# Patient Record
Sex: Female | Born: 1975 | Race: White | Hispanic: Yes | Marital: Married | Smoking: Never smoker
Health system: Southern US, Community
[De-identification: ages and names within clinical notes are randomized; demographics above are authoritative.]

## PROBLEM LIST (undated history)

## (undated) DIAGNOSIS — N200 Calculus of kidney: Secondary | ICD-10-CM

## (undated) HISTORY — DX: Calculus of kidney: N20.0

---

## 2004-01-17 ENCOUNTER — Emergency Department: Payer: Self-pay | Admitting: General Practice

## 2004-06-29 ENCOUNTER — Ambulatory Visit (HOSPITAL_COMMUNITY): Admission: RE | Admit: 2004-06-29 | Discharge: 2004-06-29 | Payer: Self-pay | Admitting: Obstetrics & Gynecology

## 2004-09-21 ENCOUNTER — Ambulatory Visit (HOSPITAL_COMMUNITY): Admission: RE | Admit: 2004-09-21 | Discharge: 2004-09-21 | Payer: Self-pay | Admitting: Obstetrics & Gynecology

## 2004-10-20 ENCOUNTER — Ambulatory Visit (HOSPITAL_COMMUNITY): Admission: RE | Admit: 2004-10-20 | Discharge: 2004-10-20 | Payer: Self-pay | Admitting: Obstetrics

## 2004-10-26 ENCOUNTER — Inpatient Hospital Stay (HOSPITAL_COMMUNITY): Admission: AD | Admit: 2004-10-26 | Discharge: 2004-10-26 | Payer: Self-pay | Admitting: Obstetrics & Gynecology

## 2004-10-29 ENCOUNTER — Inpatient Hospital Stay (HOSPITAL_COMMUNITY): Admission: AD | Admit: 2004-10-29 | Discharge: 2004-10-31 | Payer: Self-pay | Admitting: Obstetrics

## 2004-10-29 ENCOUNTER — Encounter (INDEPENDENT_AMBULATORY_CARE_PROVIDER_SITE_OTHER): Payer: Self-pay | Admitting: *Deleted

## 2004-10-29 HISTORY — PX: TUBAL LIGATION: SHX77

## 2005-08-11 ENCOUNTER — Inpatient Hospital Stay (HOSPITAL_COMMUNITY): Admission: AD | Admit: 2005-08-11 | Discharge: 2005-08-11 | Payer: Self-pay | Admitting: Gynecology

## 2007-11-21 ENCOUNTER — Emergency Department (HOSPITAL_COMMUNITY): Admission: EM | Admit: 2007-11-21 | Discharge: 2007-11-21 | Payer: Self-pay | Admitting: Emergency Medicine

## 2010-05-26 NOTE — Op Note (Signed)
[name], [name]             ACCOUNT NO.:  [id number]   MEDICAL RECORD NO.:  [id number]          PATIENT TYPE:  INP   LOCATION:  9111                          FACILITY:  [facility]   PHYSICIAN:  Courtney [name], M.D.DATE OF BIRTH:  30-Jul-1975   DATE OF PROCEDURE:  10/29/2004  DATE OF DISCHARGE:                                 OPERATIVE REPORT   PREOPERATIVE DIAGNOSIS:  Desires sterilization.   POSTOPERATIVE DIAGNOSIS:  Desires sterilization.   PROCEDURE:  Bilateral partial salpingectomy.   SURGEON:  Coral Ceo, M.D.   ANESTHESIA:  General.   ESTIMATED BLOOD LOSS:  Negligible.   COMPLICATIONS:  None.   SPECIMEN:  Approximately 2 cm segments of right and left fallopian tube.   DESCRIPTION OF PROCEDURE:  The patient was brought to operating room and  after satisfactory general endotracheal anesthesia, the abdomen was prepped  and draped in usual sterile fashion. The small inferior umbilical incision  was made with a scalpel and deepened down to fascia with a scalpel. Right-  angle retractors were placed in the incision and the fascia was grasped in  the midline with Kocher forceps and was cut in between transversely with  curved Mayo scissors. The incision was extended to left and to right with  the curved Mayo scissors. The right angle retractors were placed in the  incision and the right fallopian tube was identified and was grasped with  Babcock clamp. The tube was followed from cornual end to the fimbrial end  serially in the grasp of Babcock clamps and then regrasped in the isthmic  area of the tube with the Babcock clamp. Knuckle of tube beneath the Babcock  clamp was doubly ligated with #1 plain catgut and section of tube above the  knot was excised with Metzenbaum scissors and submitted to pathology for  evaluation. There is no active bleeding from the tubal stumps and therefore  placed back in its normal anatomic position. Same procedure was performed on  opposite  side without complications. The abdomen was then closed as follows.  The peritoneum and fascia was closed as one with continuous suture of 2-0  Vicryl. Skin was closed with continuous subcuticular suture of 3-0 Monocryl.  Sterile bandage was applied to the incision closure. Surgical technician  indicated that all needle, sponge and instrument counts were correct. The  patient tolerated procedure well and was transported to recovery room in  satisfactory condition.      Courtney [name], M.D.  Electronically Signed    CAH/MEDQ  D:  10/29/2004  T:  10/29/2004  Job:  [id number]

## 2010-10-06 ENCOUNTER — Emergency Department (HOSPITAL_COMMUNITY)
Admission: EM | Admit: 2010-10-06 | Discharge: 2010-10-06 | Disposition: A | Discharge status: Home / Self Care | Payer: No Typology Code available for payment source | Attending: Emergency Medicine | Admitting: Emergency Medicine

## 2010-10-06 DIAGNOSIS — M549 Dorsalgia, unspecified: Secondary | ICD-10-CM | POA: Insufficient documentation

## 2010-10-06 DIAGNOSIS — M545 Low back pain, unspecified: Secondary | ICD-10-CM | POA: Insufficient documentation

## 2010-10-06 DIAGNOSIS — S335XXA Sprain of ligaments of lumbar spine, initial encounter: Secondary | ICD-10-CM | POA: Insufficient documentation

## 2010-10-11 LAB — URINE MICROSCOPIC-ADD ON

## 2010-10-11 LAB — COMPREHENSIVE METABOLIC PANEL
ALT: 11
AST: 20
Albumin: 4
Alkaline Phosphatase: 54
BUN: 8
CO2: 27
Calcium: 9.2
Chloride: 107
Creatinine, Ser: 0.73
GFR calc Af Amer: >60
GFR calc non Af Amer: >60
Glucose, Bld: 96
Potassium: 3.8
Sodium: 139
Total Bilirubin: 1.2
Total Protein: 6.4

## 2010-10-11 LAB — URINE CULTURE: Colony Count: >=100000

## 2010-10-11 LAB — CBC
HCT: 37
Hemoglobin: 12.2
MCHC: 32.9
MCV: 89.8
Platelets: 187
RBC: 4.12
RDW: 12.5
WBC: 6.2

## 2010-10-11 LAB — DIFFERENTIAL
Basophils Absolute: 0
Basophils Relative: 0
Eosinophils Absolute: 0.1
Eosinophils Relative: 2
Lymphocytes Relative: 23
Lymphs Abs: 1.4
Monocytes Absolute: 0.4
Monocytes Relative: 7
Neutro Abs: 4.3
Neutrophils Relative %: 69

## 2010-10-11 LAB — URINALYSIS, ROUTINE W REFLEX MICROSCOPIC
Bilirubin Urine: NEGATIVE
Glucose, UA: NEGATIVE
Ketones, ur: NEGATIVE
Nitrite: NEGATIVE
Protein, ur: >300 — AB
Specific Gravity, Urine: 1.017
Urobilinogen, UA: 1
pH: 6

## 2010-10-11 LAB — LIPASE, BLOOD: Lipase: 28

## 2010-10-11 LAB — POCT PREGNANCY, URINE: Preg Test, Ur: NEGATIVE

## 2014-03-24 ENCOUNTER — Ambulatory Visit (INDEPENDENT_AMBULATORY_CARE_PROVIDER_SITE_OTHER): Payer: BC Managed Care – PPO | Admitting: Internal Medicine

## 2014-03-24 ENCOUNTER — Encounter: Payer: Self-pay | Admitting: Internal Medicine

## 2014-03-24 VITALS — BP 114/64 | HR 79 | Temp 98.9°F | Wt 126.0 lb

## 2014-03-24 DIAGNOSIS — J309 Allergic rhinitis, unspecified: Secondary | ICD-10-CM

## 2014-03-24 NOTE — Patient Instructions (Addendum)
  Zyrtec- take 1 pill at night x 1-2 weeks Flonase- 1 spray each nostril every morning x 1-2 weeks  Allergic Rhinitis Allergic rhinitis is when the mucous membranes in the nose respond to allergens. Allergens are particles in the air that cause your body to have an allergic reaction. This causes you to release allergic antibodies. Through a chain of events, these eventually cause you to release histamine into the blood stream. Although meant to protect the body, it is this release of histamine that causes your discomfort, such as frequent sneezing, congestion, and an itchy, runny nose.  CAUSES  Seasonal allergic rhinitis (hay fever) is caused by pollen allergens that may come from grasses, trees, and weeds. Year-round allergic rhinitis (perennial allergic rhinitis) is caused by allergens such as house dust mites, pet dander, and mold spores.  SYMPTOMS   Nasal stuffiness (congestion).  Itchy, runny nose with sneezing and tearing of the eyes. DIAGNOSIS  Your health care provider can help you determine the allergen or allergens that trigger your symptoms. If you and your health care provider are unable to determine the allergen, skin or blood testing may be used. TREATMENT  Allergic rhinitis does not have a cure, but it can be controlled by:  Medicines and allergy shots (immunotherapy).  Avoiding the allergen. Hay fever may often be treated with antihistamines in pill or nasal spray forms. Antihistamines block the effects of histamine. There are over-the-counter medicines that may help with nasal congestion and swelling around the eyes. Check with your health care provider before taking or giving this medicine.  If avoiding the allergen or the medicine prescribed do not work, there are many new medicines your health care provider can prescribe. Stronger medicine may be used if initial measures are ineffective. Desensitizing injections can be used if medicine and avoidance does not work.  Desensitization is when a patient is given ongoing shots until the body becomes less sensitive to the allergen. Make sure you follow up with your health care provider if problems continue. HOME CARE INSTRUCTIONS It is not possible to completely avoid allergens, but you can reduce your symptoms by taking steps to limit your exposure to them. It helps to know exactly what you are allergic to so that you can avoid your specific triggers. SEEK MEDICAL CARE IF:   You have a fever.  You develop a cough that does not stop easily (persistent).  You have shortness of breath.  You start wheezing.  Symptoms interfere with normal daily activities. Document Released: 09/19/2000 Document Revised: 12/30/2012 Document Reviewed: 09/01/2012 [facility] Patient Information 2015 [place]. This information is not intended to replace advice given to you by your health care provider. Make sure you discuss any questions you have with your health care provider.

## 2014-03-24 NOTE — Progress Notes (Signed)
Pre visit review using our clinic review tool, if applicable. No additional management support is needed unless otherwise documented below in the visit note. 

## 2014-03-24 NOTE — Progress Notes (Signed)
HPI  Pt presents to the clinic today with c/o nasal congestion, sore throat and cough. She reports this started 3 days ago. The cough is productive of green mucous, mostly in the morning. She is blowing clear mucous out of her nose. She denies pain with swallowing but is hoarse. She denies fever, chills or body aches. She has tried Mucinex with some relief. She has had contacts with similar symptoms, diagnosed with allergies. She has no history of allergies herself. She does not smoke.  Review of Systems     No past medical history on file.  No family history on file.  History   Social History  . Marital Status: Married    Spouse Name: N/A  . Number of Children: N/A  . Years of Education: N/A   Occupational History  . Not on file.   Social History Main Topics  . Smoking status: Not on file  . Smokeless tobacco: Not on file  . Alcohol Use: Not on file  . Drug Use: Not on file  . Sexual Activity: Not on file   Other Topics Concern  . Not on file   Social History Narrative  . No narrative on file    No Known Allergies   Constitutional: Positive headache. Denies fatigue, fever or abrupt weight changes.  HEENT:  Positive nasal congestion, sore throat. Denies eye redness, eye pain, pressure behind the eyes, facial pain, ear pain, ringing in the ears, wax buildup, runny nose or bloody nose. Respiratory: Positive cough. Denies difficulty breathing or shortness of breath.  Cardiovascular: Denies chest pain, chest tightness, palpitations or swelling in the hands or feet.   No other specific complaints in a complete review of systems (except as listed in HPI above).  Objective:   Wt 126 lb (57.153 kg) Wt Readings from Last 3 Encounters:  03/24/14 126 lb (57.153 kg)     General: Appears his stated age, well developed, well nourished in NAD. HEENT: Head: normal shape and size, no sinus tenderness noted; Ears: Tm's gray and intact, normal light reflex; Nose: mucosa boggy and  moist, septum midline; Throat/Mouth: + PND. Teeth present, mucosa erythematous and moist, no exudate noted, no lesions or ulcerations noted.  Neck: Lymphadenopathy noted.  Cardiovascular: Normal rate and rhythm. S1,S2 noted.  No murmur, rubs or gallops noted.  Pulmonary/Chest: Normal effort and positive vesicular breath sounds. No respiratory distress. No wheezes, rales or ronchi noted.      Assessment & Plan:   Allergic Rhinitis:  Get some rest and drink plenty of water Do salt water gargles for the sore throat Start Zyrtec 1 tab QHS x 1-2 weeks Start Flonase 1 spray each nostril QAM x 1 -2 weeks Watch for fever, chills, colored nasal mucous  RTC as needed or if symptoms persist.

## 2014-04-05 ENCOUNTER — Ambulatory Visit: Payer: BC Managed Care – PPO | Admitting: Internal Medicine

## 2014-04-13 ENCOUNTER — Ambulatory Visit (INDEPENDENT_AMBULATORY_CARE_PROVIDER_SITE_OTHER): Payer: BC Managed Care – PPO | Admitting: Internal Medicine

## 2014-04-13 ENCOUNTER — Other Ambulatory Visit (HOSPITAL_COMMUNITY)
Admission: RE | Admit: 2014-04-13 | Discharge: 2014-04-13 | Disposition: A | Discharge status: Home / Self Care | Payer: BC Managed Care – PPO | Source: Ambulatory Visit | Attending: Internal Medicine | Admitting: Internal Medicine

## 2014-04-13 ENCOUNTER — Encounter: Payer: Self-pay | Admitting: Internal Medicine

## 2014-04-13 VITALS — BP 114/68 | HR 72 | Temp 98.9°F | Wt 129.0 lb

## 2014-04-13 DIAGNOSIS — R6889 Other general symptoms and signs: Secondary | ICD-10-CM | POA: Diagnosis not present

## 2014-04-13 DIAGNOSIS — Z0000 Encounter for general adult medical examination without abnormal findings: Secondary | ICD-10-CM | POA: Diagnosis not present

## 2014-04-13 DIAGNOSIS — Z01419 Encounter for gynecological examination (general) (routine) without abnormal findings: Secondary | ICD-10-CM

## 2014-04-13 DIAGNOSIS — Z1151 Encounter for screening for human papillomavirus (HPV): Secondary | ICD-10-CM | POA: Insufficient documentation

## 2014-04-13 NOTE — Addendum Note (Signed)
Addended by: Roena MaladyEVONTENNO, Aviah Sorci Y on: 04/13/2014 04:53 PM   Modules accepted: Orders

## 2014-04-13 NOTE — Progress Notes (Signed)
Pre visit review using our clinic review tool, if applicable. No additional management support is needed unless otherwise documented below in the visit note. 

## 2014-04-13 NOTE — Patient Instructions (Signed)

## 2014-04-13 NOTE — Progress Notes (Signed)
HPI  Pt presents to the clinic today to establish care. She has not had a PCP in many years. She would like to get her physical exam today if she could.  Flu: never Tetanus: 2010 LMP: 03/23/2013 Pap Smear: 2006 Dentist: as needed  She does report feeling cold all the time. She would like to have her iron checked. She has never been anemic that she is aware of.  History reviewed. No pertinent past medical history.  No current outpatient prescriptions on file.   No current facility-administered medications for this visit.    No Known Allergies  Family History  Problem Relation Age of Onset  . Cancer Mother     Breast  . Lung disease Father     History   Social History  . Marital Status: Married    Spouse Name: N/A  . Number of Children: N/A  . Years of Education: N/A   Occupational History  . Not on file.   Social History Main Topics  . Smoking status: Never Smoker   . Smokeless tobacco: Never Used  . Alcohol Use: 0.0 oz/week    0 Standard drinks or equivalent per week     Comment: rare  . Drug Use: Not on file  . Sexual Activity: Not on file   Other Topics Concern  . Not on file   Social History Narrative    ROS:  Constitutional: Denies fever, malaise, fatigue, headache or abrupt weight changes.  HEENT: Denies eye pain, eye redness, ear pain, ringing in the ears, wax buildup, runny nose, nasal congestion, bloody nose, or sore throat. Respiratory: Denies difficulty breathing, shortness of breath, cough or sputum production.   Cardiovascular: Denies chest pain, chest tightness, palpitations or swelling in the hands or feet.  Gastrointestinal: Denies abdominal pain, bloating, constipation, diarrhea or blood in the stool.  GU: Denies frequency, urgency, pain with urination, blood in urine, odor or discharge. Musculoskeletal: Denies decrease in range of motion, difficulty with gait, muscle pain or joint pain and swelling.  Skin: Denies redness, rashes, lesions  or ulcercations.  Neurological: Denies dizziness, difficulty with memory, difficulty with speech or problems with balance and coordination.  Psych: She denies anxiety, depression, SI/HI  No other specific complaints in a complete review of systems (except as listed in HPI above).  PE:  BP 114/68 mmHg  Pulse 72  Temp(Src) 98.9 F (37.2 C) (Oral)  Wt 129 lb (58.514 kg)  SpO2 99%  LMP 03/23/2014 Wt Readings from Last 3 Encounters:  04/13/14 129 lb (58.514 kg)  03/24/14 126 lb (57.153 kg)    General: Appears her stated age, well developed, well nourished in NAD. HEENT: Head: normal shape and size; Eyes: sclera white, no icterus, conjunctiva pink, PERRLA and EOMs intact; Ears: Tm's gray and intact, normal light reflex; Nose: mucosa pink and moist, septum midline; Throat/Mouth: Teeth present, mucosa pink and moist, no lesions or ulcerations noted.  Neck: Neck supple, trachea midline. No masses, lumps or thyromegaly present.  Cardiovascular: Normal rate and rhythm. S1,S2 noted.  No murmur, rubs or gallops noted. No JVD or BLE edema. No carotid bruits noted. Pulmonary/Chest: Normal effort and positive vesicular breath sounds. No respiratory distress. No wheezes, rales or ronchi noted.  Abdomen: Soft and nontender. Normal bowel sounds, no bruits noted. No distention or masses noted. Liver, spleen and kidneys non palpable. Pelvic: Normal female anatomy. No discharge or odor noted. No CMT tenderness. Adnexa non palpable. Musculoskeletal: Normal range of motion. Strength 5/5 BUE/BLE. No signs of  joint swelling. No difficulty with gait.  Neurological: Alert and oriented. Cranial nerves II-XII grossly intact. Coordination normal.  Psychiatric: Mood and affect normal. Behavior is normal. Judgment and thought content normal.     BMET    Component Value Date/Time   NA 139 11/21/2007 2045   K 3.8 11/21/2007 2045   CL 107 11/21/2007 2045   CO2 27 11/21/2007 2045   GLUCOSE 96 11/21/2007 2045    BUN 8 11/21/2007 2045   CREATININE 0.73 11/21/2007 2045   CALCIUM 9.2 11/21/2007 2045   GFRNONAA >60 11/21/2007 2045   GFRAA  11/21/2007 2045    >60        The eGFR has been calculated using the MDRD equation. This calculation has not been validated in all clinical    Lipid Panel  No results found for: CHOL, TRIG, HDL, CHOLHDL, VLDL, LDLCALC  CBC    Component Value Date/Time   WBC 6.2 11/21/2007 2209   RBC 4.12 11/21/2007 2209   HGB 12.2 11/21/2007 2209   HCT 37.0 11/21/2007 2209   PLT 187 11/21/2007 2209   MCV 89.8 11/21/2007 2209   MCHC 32.9 11/21/2007 2209   RDW 12.5 11/21/2007 2209   LYMPHSABS 1.4 11/21/2007 2209   MONOABS 0.4 11/21/2007 2209   EOSABS 0.1 11/21/2007 2209   BASOSABS 0.0 11/21/2007 2209    Hgb A1C No results found for: HGBA1C   Assessment and Plan:  Preventative Health  Maintenance:  She declines flu shot Encouraged her to visit a dentist at least on an annual basis Will check CBC, CMET and Lipid Profile today Pap obtained- will send to lab  Cold sensation:  Will check TSH and Iron levels RTC in 1 year or sooner if needed

## 2014-04-14 LAB — COMPREHENSIVE METABOLIC PANEL
ALT: 10 U/L (ref 0–35)
AST: 12 U/L (ref 0–37)
Albumin: 4.3 g/dL (ref 3.5–5.2)
Alkaline Phosphatase: 47 U/L (ref 39–117)
BUN: 13 mg/dL (ref 6–23)
CO2: 28 mEq/L (ref 19–32)
Calcium: 9.4 mg/dL (ref 8.4–10.5)
Chloride: 106 mEq/L (ref 96–112)
Creatinine, Ser: 0.72 mg/dL (ref 0.40–1.20)
GFR: 96.77 mL/min (ref >60.00)
Glucose, Bld: 80 mg/dL (ref 70–99)
Potassium: 3.9 mEq/L (ref 3.5–5.1)
Sodium: 137 mEq/L (ref 135–145)
Total Bilirubin: 1.1 mg/dL (ref 0.2–1.2)
Total Protein: 6.9 g/dL (ref 6.0–8.3)

## 2014-04-14 LAB — LIPID PANEL
Cholesterol: 124 mg/dL (ref 0–200)
HDL: 46.7 mg/dL (ref >39.00)
LDL Cholesterol: 65 mg/dL (ref 0–99)
NonHDL: 77.3
Total CHOL/HDL Ratio: 3
Triglycerides: 60 mg/dL (ref 0.0–149.0)
VLDL: 12 mg/dL (ref 0.0–40.0)

## 2014-04-14 LAB — IRON: Iron: 72 ug/dL (ref 42–145)

## 2014-04-14 LAB — CBC
HCT: 36.4 % (ref 36.0–46.0)
Hemoglobin: 12.3 g/dL (ref 12.0–15.0)
MCHC: 33.8 g/dL (ref 30.0–36.0)
MCV: 87 fl (ref 78.0–100.0)
Platelets: 194 10*3/uL (ref 150.0–400.0)
RBC: 4.19 Mil/uL (ref 3.87–5.11)
RDW: 12.9 % (ref 11.5–15.5)
WBC: 5.4 10*3/uL (ref 4.0–10.5)

## 2014-04-14 LAB — TSH: TSH: 2.58 u[IU]/mL (ref 0.35–4.50)

## 2014-04-16 LAB — CYTOLOGY - PAP

## 2015-06-26 ENCOUNTER — Emergency Department (HOSPITAL_COMMUNITY)
Admission: EM | Admit: 2015-06-26 | Discharge: 2015-06-26 | Disposition: A | Discharge status: Home / Self Care | Payer: BC Managed Care – PPO | Attending: Emergency Medicine | Admitting: Emergency Medicine

## 2015-06-26 ENCOUNTER — Encounter (HOSPITAL_COMMUNITY): Payer: Self-pay | Admitting: Emergency Medicine

## 2015-06-26 ENCOUNTER — Emergency Department (HOSPITAL_COMMUNITY): Payer: BC Managed Care – PPO

## 2015-06-26 DIAGNOSIS — R109 Unspecified abdominal pain: Secondary | ICD-10-CM | POA: Diagnosis present

## 2015-06-26 DIAGNOSIS — N200 Calculus of kidney: Secondary | ICD-10-CM | POA: Diagnosis not present

## 2015-06-26 DIAGNOSIS — N1330 Unspecified hydronephrosis: Secondary | ICD-10-CM | POA: Insufficient documentation

## 2015-06-26 LAB — CBC WITH DIFFERENTIAL/PLATELET
Basophils Absolute: 0 10*3/uL (ref 0.0–0.1)
Basophils Relative: 0 %
Eosinophils Absolute: 0 10*3/uL (ref 0.0–0.7)
Eosinophils Relative: 0 %
HCT: 37.2 % (ref 36.0–46.0)
Hemoglobin: 12.2 g/dL (ref 12.0–15.0)
Lymphocytes Relative: 9 %
Lymphs Abs: 0.2 10*3/uL — ABNORMAL LOW (ref 0.7–4.0)
MCH: 28.8 pg (ref 26.0–34.0)
MCHC: 32.8 g/dL (ref 30.0–36.0)
MCV: 87.9 fL (ref 78.0–100.0)
Monocytes Absolute: 0 10*3/uL — ABNORMAL LOW (ref 0.1–1.0)
Monocytes Relative: 0 %
Neutro Abs: 2.4 10*3/uL (ref 1.7–7.7)
Neutrophils Relative %: 91 %
Platelets: 155 10*3/uL (ref 150–400)
RBC: 4.23 MIL/uL (ref 3.87–5.11)
RDW: 12.2 % (ref 11.5–15.5)
WBC: 2.7 10*3/uL — ABNORMAL LOW (ref 4.0–10.5)

## 2015-06-26 LAB — COMPREHENSIVE METABOLIC PANEL
ALT: 12 U/L — ABNORMAL LOW (ref 14–54)
AST: 19 U/L (ref 15–41)
Albumin: 4.3 g/dL (ref 3.5–5.0)
Alkaline Phosphatase: 44 U/L (ref 38–126)
Anion gap: 5 (ref 5–15)
BUN: 10 mg/dL (ref 6–20)
CO2: 24 mmol/L (ref 22–32)
Calcium: 8.9 mg/dL (ref 8.9–10.3)
Chloride: 107 mmol/L (ref 101–111)
Creatinine, Ser: 0.88 mg/dL (ref 0.44–1.00)
GFR calc Af Amer: >60 mL/min (ref >60)
GFR calc non Af Amer: >60 mL/min (ref >60)
Glucose, Bld: 137 mg/dL — ABNORMAL HIGH (ref 65–99)
Potassium: 3.3 mmol/L — ABNORMAL LOW (ref 3.5–5.1)
Sodium: 136 mmol/L (ref 135–145)
Total Bilirubin: 1.6 mg/dL — ABNORMAL HIGH (ref 0.3–1.2)
Total Protein: 6.7 g/dL (ref 6.5–8.1)

## 2015-06-26 LAB — URINALYSIS, ROUTINE W REFLEX MICROSCOPIC
Bilirubin Urine: NEGATIVE
Glucose, UA: NEGATIVE mg/dL
Hgb urine dipstick: NEGATIVE
Ketones, ur: NEGATIVE mg/dL
Leukocytes, UA: NEGATIVE
Nitrite: NEGATIVE
Protein, ur: NEGATIVE mg/dL
Specific Gravity, Urine: 1.014 (ref 1.005–1.030)
pH: 7.5 (ref 5.0–8.0)

## 2015-06-26 LAB — I-STAT BETA HCG BLOOD, ED (MC, WL, AP ONLY): I-stat hCG, quantitative: <5 m[IU]/mL (ref <5)

## 2015-06-26 MED ORDER — FENTANYL CITRATE (PF) 100 MCG/2ML IJ SOLN
50.0000 ug | INTRAMUSCULAR | Status: DC | PRN
Start: 2015-06-26 — End: 2015-06-26
  Administered 2015-06-26: 50 ug via INTRAVENOUS
  Filled 2015-06-26 (×2): qty 2

## 2015-06-26 MED ORDER — TAMSULOSIN HCL 0.4 MG PO CAPS: 0.4000 mg | ORAL_CAPSULE | Freq: Every day | ORAL | Status: DC

## 2015-06-26 MED ORDER — OXYCODONE-ACETAMINOPHEN 5-325 MG PO TABS: 1.0000 | ORAL_TABLET | ORAL | Status: DC | PRN

## 2015-06-26 MED ORDER — MORPHINE SULFATE (PF) 4 MG/ML IV SOLN
4.0000 mg | Freq: Once | INTRAVENOUS | Status: AC
  Administered 2015-06-26: 4 mg via INTRAVENOUS
  Filled 2015-06-26: qty 1

## 2015-06-26 MED ORDER — IBUPROFEN 600 MG PO TABS: 600.0000 mg | ORAL_TABLET | Freq: Four times a day (QID) | ORAL | Status: DC | PRN

## 2015-06-26 MED ORDER — SODIUM CHLORIDE 0.9 % IV BOLUS (SEPSIS)
1000.0000 mL | Freq: Once | INTRAVENOUS | Status: AC
  Administered 2015-06-26: 1000 mL via INTRAVENOUS

## 2015-06-26 MED ORDER — ONDANSETRON 8 MG PO TBDP: 8.0000 mg | ORAL_TABLET | Freq: Three times a day (TID) | ORAL | Status: DC | PRN

## 2015-06-26 MED ORDER — IOPAMIDOL (ISOVUE-300) INJECTION 61%
INTRAVENOUS | Status: AC
  Administered 2015-06-26: 100 mL
  Filled 2015-06-26: qty 100

## 2015-06-26 MED ORDER — KETOROLAC TROMETHAMINE 30 MG/ML IJ SOLN
30.0000 mg | Freq: Once | INTRAMUSCULAR | Status: AC
  Administered 2015-06-26: 30 mg via INTRAVENOUS
  Filled 2015-06-26: qty 1

## 2015-06-26 MED ORDER — MORPHINE SULFATE (PF) 4 MG/ML IV SOLN
8.0000 mg | Freq: Once | INTRAVENOUS | Status: AC
  Administered 2015-06-26: 8 mg via INTRAVENOUS
  Filled 2015-06-26: qty 2

## 2015-06-26 MED ORDER — ONDANSETRON HCL 4 MG/2ML IJ SOLN
4.0000 mg | Freq: Once | INTRAMUSCULAR | Status: AC
  Administered 2015-06-26: 4 mg via INTRAVENOUS
  Filled 2015-06-26: qty 2

## 2015-06-26 NOTE — ED Notes (Signed)
Pt sts right sided flank pain starting this am that is severe with some N/V

## 2015-06-26 NOTE — ED Provider Notes (Signed)
CSN: [id number]     Arrival date & time 06/26/15  1350 History   First MD Initiated Contact with Patient 06/26/15 1419     Chief Complaint  Patient presents with  . Flank Pain     (Consider location/radiation/quality/duration/timing/severity/associated sxs/prior Treatment) HPI   Patient is a 40 year old female with no significant past medical history who presents the ED with right flank pain since she woke this morning around 9 AM. Pain has progressively gotten worse, radiating into her right buttocks and around to her right lower quadrant, severe, 10/10, she took 2 Aleve which did not help. Movement of her right leg makes the pain worse. She states associated nausea, vomiting and decreased urine. She denies fever, chills, dysuria, hematuria, changes in bowel habits, Vaginal discharge or vaginal bleeding, chest pain, shortness of breath. Of note, patient has been fasting for Ramadan.  History reviewed. No pertinent past medical history. Past Surgical History  Procedure Laterality Date  . Tubal ligation  10/29/2004   Family History  Problem Relation Age of Onset  . Cancer Mother     Breast  . Lung disease Father    Social History  Substance Use Topics  . Smoking status: Never Smoker   . Smokeless tobacco: Never Used  . Alcohol Use: 0.0 oz/week    0 Standard drinks or equivalent per week     Comment: rare   OB History    No data available     Review of Systems  Constitutional: Negative for fever and chills.  Eyes: Negative for visual disturbance.  Respiratory: Negative for shortness of breath.   Cardiovascular: Negative for chest pain.  Gastrointestinal: Positive for nausea and vomiting. Negative for abdominal pain, diarrhea, constipation and abdominal distention.  Genitourinary: Positive for flank pain and decreased urine volume. Negative for dysuria, hematuria, vaginal bleeding, vaginal discharge and difficulty urinating.  Musculoskeletal: Negative for neck pain.  Skin:  Negative for rash.  Allergic/Immunologic: Negative for immunocompromised state.  Neurological: Negative for dizziness, syncope, weakness and headaches.      Allergies  Review of patient's allergies indicates no known allergies.  Home Medications   Prior to Admission medications   Not on File   BP 122/61 mmHg  Pulse 76  Temp(Src) 98.3 F (36.8 C) (Oral)  Resp 24  SpO2 98% Physical Exam  Constitutional: She appears well-developed and well-nourished. No distress.  HENT:  Head: Normocephalic and atraumatic.  Eyes: Conjunctivae are normal.  Neck: Normal range of motion.  Cardiovascular: Normal rate, regular rhythm and normal heart sounds.  Exam reveals no gallop and no friction rub.   No murmur heard. Pulses:      Dorsalis pedis pulses are 2+ on the right side, and 2+ on the left side.  Pulmonary/Chest: Effort normal.  Abdominal: Normal appearance and bowel sounds are normal. She exhibits no distension. There is no rebound and no CVA tenderness.  Palpation of the right lower quadrant reproduced pain in the right flank  Musculoskeletal: Normal range of motion. She exhibits no edema.  Examination of patient's back and flank revealed no deformities, ecchymosis, patient was not tender to palpation of the thoracic or cervical spine or paraspinal muscles, patient was TTP of the right flank area  Neurological: She is alert. Coordination normal.  Skin: Skin is warm and dry. No rash noted. She is not diaphoretic.  Psychiatric: She has a normal mood and affect. Her behavior is normal.  Nursing note and vitals reviewed.   ED Course  Procedures (including critical care  time) Labs Review Labs Reviewed  CBC WITH DIFFERENTIAL/PLATELET - Abnormal; Notable for the following:    WBC 2.7 (*)    Lymphs Abs 0.2 (*)    Monocytes Absolute 0.0 (*)    All other components within normal limits  COMPREHENSIVE METABOLIC PANEL - Abnormal; Notable for the following:    Potassium 3.3 (*)    Glucose,  Bld 137 (*)    ALT 12 (*)    Total Bilirubin 1.6 (*)    All other components within normal limits  URINALYSIS, ROUTINE W REFLEX MICROSCOPIC (NOT AT [facility])  I-STAT BETA HCG BLOOD, ED ([facility], [facility], [facility] ONLY)    Imaging Review No results found. I have personally reviewed and evaluated these images and lab results as part of my medical decision-making.   EKG Interpretation None      MDM   Final diagnoses:  None   D/t patient's presentation and history this is likely kidney stones. Differential diagnoses include ovarian torsion and appendicitis. Plan is to get a CT with oral contrast and urinalysis to evaluate for kidney stones and appendicitis. If those are negative, plan for pelvic ultrasound and pelvic exam.   Patient care was signed out to [employer] Corporation[employer]ana Kirichenko, PA-C at change of shift.     Jerre SimonJessica L Shaul Trautman, PA 06/26/15 1551  Gerhard Munchobert Lockwood, MD 06/27/15 [phone]

## 2015-06-26 NOTE — ED Provider Notes (Signed)
3:47 PM Pt signed out to me at shift change. Pt with acute onset of right lower back pain that is radiating into right lower abdomen and right leg. States woke up with it this mroning. Reports some nausea and vomiting. Denies fever. Reports urinating a little at a time, no other urinary symptoms or hematuria. Pt with RLQ tenderness and right cva tenderness. Labs with no significant findings. Will get CT abd and pelvis with contrast to evaluate for hydronephrosis and appendicitis. Pt also reports pain coming back. Will reorder morphine.   5:47 PM CT showing obstructing 6 mm distal right ureteral stone with moderate to severe right obstructive uropathy. Urine with no signs of infection. Patient's pain is controlled at this time, she rates it as 0 out of 10. Plan to discharge her home with close urology follow-up given the size of her stone. Will treat with ibuprofen, Percocet, Zofran, Flomax. Return precautions for uncontrolled pain, vomiting, fever discussed.  Filed Vitals:   06/26/15 1358 06/26/15 1500 06/26/15 1700 06/26/15 1743  BP: 111/66 122/61 121/75 128/76  Pulse: 87 76 100 121  Temp: 98.3 F (36.8 C)     TempSrc: Oral     Resp: 24   12  SpO2: 100% 98% 97% 97%     Jaynie Crumbleatyana Jocilynn Grade, PA-C 06/26/15 1748  Lavera Guiseana Duo Liu, MD 06/27/15 0040

## 2015-06-26 NOTE — Discharge Instructions (Signed)
Ibuprofen for pain as prescribed. Percocet for severe pain as prescribed as needed. zofran for nausea. flomax to help to pass the stone. Follow up with urology. Return if persistent vomiting, unable to control pain, fever, any new concerning symptoms.    Kidney Stones Kidney stones (urolithiasis) are deposits that form inside your kidneys. The intense pain is caused by the stone moving through the urinary tract. When the stone moves, the ureter goes into spasm around the stone. The stone is usually passed in the urine.  CAUSES   A disorder that makes certain neck glands produce too much parathyroid hormone (primary hyperparathyroidism).  A buildup of uric acid crystals, similar to gout in your joints.  Narrowing (stricture) of the ureter.  A kidney obstruction present at birth (congenital obstruction).  Previous surgery on the kidney or ureters.  Numerous kidney infections. SYMPTOMS   Feeling sick to your stomach (nauseous).  Throwing up (vomiting).  Blood in the urine (hematuria).  Pain that usually spreads (radiates) to the groin.  Frequency or urgency of urination. DIAGNOSIS   Taking a history and physical exam.  Blood or urine tests.  CT scan.  Occasionally, an examination of the inside of the urinary bladder (cystoscopy) is performed. TREATMENT   Observation.  Increasing your fluid intake.  Extracorporeal shock wave lithotripsy--This is a noninvasive procedure that uses shock waves to break up kidney stones.  Surgery may be needed if you have severe pain or persistent obstruction. There are various surgical procedures. Most of the procedures are performed with the use of small instruments. Only small incisions are needed to accommodate these instruments, so recovery time is minimized. The size, location, and chemical composition are all important variables that will determine the proper choice of action for you. Talk to your health care provider to better  understand your situation so that you will minimize the risk of injury to yourself and your kidney.  HOME CARE INSTRUCTIONS   Drink enough water and fluids to keep your urine clear or pale yellow. This will help you to pass the stone or stone fragments.  Strain all urine through the provided strainer. Keep all particulate matter and stones for your health care provider to see. The stone causing the pain may be as small as a grain of salt. It is very important to use the strainer each and every time you pass your urine. The collection of your stone will allow your health care provider to analyze it and verify that a stone has actually passed. The stone analysis will often identify what you can do to reduce the incidence of recurrences.  Only take over-the-counter or prescription medicines for pain, discomfort, or fever as directed by your health care provider.  Keep all follow-up visits as told by your health care provider. This is important.  Get follow-up X-rays if required. The absence of pain does not always mean that the stone has passed. It may have only stopped moving. If the urine remains completely obstructed, it can cause loss of kidney function or even complete destruction of the kidney. It is your responsibility to make sure X-rays and follow-ups are completed. Ultrasounds of the kidney can show blockages and the status of the kidney. Ultrasounds are not associated with any radiation and can be performed easily in a matter of minutes.  Make changes to your daily diet as told by your health care provider. You may be told to:  Limit the amount of salt that you eat.  Eat 5 or  more servings of fruits and vegetables each day.  Limit the amount of meat, poultry, fish, and eggs that you eat.  Collect a 24-hour urine sample as told by your health care provider.You may need to collect another urine sample every 6-12 months. SEEK MEDICAL CARE IF:  You experience pain that is progressive and  unresponsive to any pain medicine you have been prescribed. SEEK IMMEDIATE MEDICAL CARE IF:   Pain cannot be controlled with the prescribed medicine.  You have a fever or shaking chills.  The severity or intensity of pain increases over 18 hours and is not relieved by pain medicine.  You develop a new onset of abdominal pain.  You feel faint or pass out.  You are unable to urinate.   This information is not intended to replace advice given to you by your health care provider. Make sure you discuss any questions you have with your health care provider.   Document Released: 12/25/2004 Document Revised: 09/15/2014 Document Reviewed: 05/28/2012 Elsevier Interactive Patient Education [employer]2016 [employer] Inc.

## 2015-06-28 ENCOUNTER — Other Ambulatory Visit: Payer: Self-pay

## 2015-06-28 ENCOUNTER — Inpatient Hospital Stay (HOSPITAL_COMMUNITY): Payer: BC Managed Care – PPO

## 2015-06-28 ENCOUNTER — Inpatient Hospital Stay (HOSPITAL_COMMUNITY)
Admission: EM | Admit: 2015-06-28 | Discharge: 2015-07-06 | DRG: 871 | Disposition: A | Discharge status: Home Health | Payer: BC Managed Care – PPO | Attending: Internal Medicine | Admitting: Internal Medicine

## 2015-06-28 ENCOUNTER — Emergency Department (HOSPITAL_COMMUNITY): Payer: BC Managed Care – PPO

## 2015-06-28 ENCOUNTER — Encounter (HOSPITAL_COMMUNITY): Payer: Self-pay | Admitting: Emergency Medicine

## 2015-06-28 DIAGNOSIS — E876 Hypokalemia: Secondary | ICD-10-CM | POA: Diagnosis present

## 2015-06-28 DIAGNOSIS — N201 Calculus of ureter: Secondary | ICD-10-CM | POA: Diagnosis not present

## 2015-06-28 DIAGNOSIS — B9620 Unspecified Escherichia coli [E. coli] as the cause of diseases classified elsewhere: Secondary | ICD-10-CM | POA: Diagnosis present

## 2015-06-28 DIAGNOSIS — R6521 Severe sepsis with septic shock: Secondary | ICD-10-CM | POA: Diagnosis present

## 2015-06-28 DIAGNOSIS — A419 Sepsis, unspecified organism: Secondary | ICD-10-CM

## 2015-06-28 DIAGNOSIS — N139 Obstructive and reflux uropathy, unspecified: Secondary | ICD-10-CM | POA: Diagnosis present

## 2015-06-28 DIAGNOSIS — A4151 Sepsis due to Escherichia coli [E. coli]: Principal | ICD-10-CM | POA: Diagnosis present

## 2015-06-28 DIAGNOSIS — N179 Acute kidney failure, unspecified: Secondary | ICD-10-CM | POA: Diagnosis present

## 2015-06-28 DIAGNOSIS — E872 Acidosis: Secondary | ICD-10-CM | POA: Diagnosis present

## 2015-06-28 DIAGNOSIS — D696 Thrombocytopenia, unspecified: Secondary | ICD-10-CM | POA: Diagnosis present

## 2015-06-28 DIAGNOSIS — N200 Calculus of kidney: Secondary | ICD-10-CM | POA: Diagnosis not present

## 2015-06-28 DIAGNOSIS — Z87442 Personal history of urinary calculi: Secondary | ICD-10-CM

## 2015-06-28 DIAGNOSIS — E860 Dehydration: Secondary | ICD-10-CM | POA: Diagnosis present

## 2015-06-28 DIAGNOSIS — N10 Acute tubulo-interstitial nephritis: Secondary | ICD-10-CM | POA: Diagnosis present

## 2015-06-28 DIAGNOSIS — N135 Crossing vessel and stricture of ureter without hydronephrosis: Secondary | ICD-10-CM | POA: Insufficient documentation

## 2015-06-28 DIAGNOSIS — N390 Urinary tract infection, site not specified: Secondary | ICD-10-CM

## 2015-06-28 DIAGNOSIS — N3000 Acute cystitis without hematuria: Secondary | ICD-10-CM | POA: Diagnosis not present

## 2015-06-28 DIAGNOSIS — R7881 Bacteremia: Secondary | ICD-10-CM | POA: Diagnosis not present

## 2015-06-28 DIAGNOSIS — N12 Tubulo-interstitial nephritis, not specified as acute or chronic: Secondary | ICD-10-CM | POA: Diagnosis not present

## 2015-06-28 DIAGNOSIS — Z809 Family history of malignant neoplasm, unspecified: Secondary | ICD-10-CM

## 2015-06-28 DIAGNOSIS — N136 Pyonephrosis: Secondary | ICD-10-CM | POA: Diagnosis present

## 2015-06-28 DIAGNOSIS — I959 Hypotension, unspecified: Secondary | ICD-10-CM | POA: Diagnosis present

## 2015-06-28 LAB — COMPREHENSIVE METABOLIC PANEL
ALT: 32 U/L (ref 14–54)
AST: 63 U/L — ABNORMAL HIGH (ref 15–41)
Albumin: 3.4 g/dL — ABNORMAL LOW (ref 3.5–5.0)
Alkaline Phosphatase: 72 U/L (ref 38–126)
Anion gap: 13 (ref 5–15)
BUN: 53 mg/dL — ABNORMAL HIGH (ref 6–20)
CHLORIDE: 97 mmol/L — AB (ref 101–111)
CO2: 19 mmol/L — AB (ref 22–32)
CREATININE: 4.53 mg/dL — AB (ref 0.44–1.00)
Calcium: 6.8 mg/dL — ABNORMAL LOW (ref 8.9–10.3)
GFR, EST AFRICAN AMERICAN: 13 mL/min — AB (ref >60)
GFR, EST NON AFRICAN AMERICAN: 11 mL/min — AB (ref >60)
Glucose, Bld: 83 mg/dL (ref 65–99)
Potassium: 5.1 mmol/L (ref 3.5–5.1)
SODIUM: 129 mmol/L — AB (ref 135–145)
TOTAL PROTEIN: 5.5 g/dL — AB (ref 6.5–8.1)
Total Bilirubin: 1.1 mg/dL (ref 0.3–1.2)

## 2015-06-28 LAB — URINALYSIS, ROUTINE W REFLEX MICROSCOPIC
Bilirubin Urine: NEGATIVE
Glucose, UA: NEGATIVE mg/dL
Ketones, ur: NEGATIVE mg/dL
NITRITE: NEGATIVE
PROTEIN: 30 mg/dL — AB
SPECIFIC GRAVITY, URINE: 1.008 (ref 1.005–1.030)
pH: 5.5 (ref 5.0–8.0)

## 2015-06-28 LAB — CBC WITH DIFFERENTIAL/PLATELET
BASOS ABS: 0 10*3/uL (ref 0.0–0.1)
Basophils Relative: 0 %
Eosinophils Absolute: 0.1 10*3/uL (ref 0.0–0.7)
Eosinophils Relative: 1 %
HEMATOCRIT: 28.3 % — AB (ref 36.0–46.0)
HEMOGLOBIN: 9.8 g/dL — AB (ref 12.0–15.0)
LYMPHS ABS: 0.4 10*3/uL — AB (ref 0.7–4.0)
Lymphocytes Relative: 3 %
MCH: 28.8 pg (ref 26.0–34.0)
MCHC: 34.6 g/dL (ref 30.0–36.0)
MCV: 83.2 fL (ref 78.0–100.0)
MONOS PCT: 3 %
Monocytes Absolute: 0.4 10*3/uL (ref 0.1–1.0)
NEUTROS ABS: 13.1 10*3/uL — AB (ref 1.7–7.7)
Neutrophils Relative %: 93 %
Platelets: 51 10*3/uL — ABNORMAL LOW (ref 150–400)
RBC: 3.4 MIL/uL — AB (ref 3.87–5.11)
RDW: 12.9 % (ref 11.5–15.5)
WBC: 14 10*3/uL — ABNORMAL HIGH (ref 4.0–10.5)

## 2015-06-28 LAB — BASIC METABOLIC PANEL
Anion gap: 9 (ref 5–15)
BUN: 49 mg/dL — ABNORMAL HIGH (ref 6–20)
CALCIUM: 5.6 mg/dL — AB (ref 8.9–10.3)
CO2: 15 mmol/L — ABNORMAL LOW (ref 22–32)
Chloride: 109 mmol/L (ref 101–111)
Creatinine, Ser: 3.53 mg/dL — ABNORMAL HIGH (ref 0.44–1.00)
GFR calc Af Amer: 18 mL/min — ABNORMAL LOW (ref >60)
GFR, EST NON AFRICAN AMERICAN: 15 mL/min — AB (ref >60)
GLUCOSE: 89 mg/dL (ref 65–99)
POTASSIUM: 4.6 mmol/L (ref 3.5–5.1)
SODIUM: 133 mmol/L — AB (ref 135–145)

## 2015-06-28 LAB — URINE MICROSCOPIC-ADD ON

## 2015-06-28 LAB — I-STAT BETA HCG BLOOD, ED (MC, WL, AP ONLY)

## 2015-06-28 LAB — LACTIC ACID, PLASMA: LACTIC ACID, VENOUS: 2.6 mmol/L — AB (ref 0.5–2.0)

## 2015-06-28 LAB — CORTISOL: Cortisol, Plasma: 20.1 ug/dL

## 2015-06-28 LAB — I-STAT CG4 LACTIC ACID, ED
LACTIC ACID, VENOUS: 3.41 mmol/L — AB (ref 0.5–2.0)
Lactic Acid, Venous: 2.02 mmol/L (ref 0.5–2.0)

## 2015-06-28 LAB — CARBOXYHEMOGLOBIN
CARBOXYHEMOGLOBIN: 1 % (ref 0.5–1.5)
METHEMOGLOBIN: 0.8 % (ref 0.0–1.5)
O2 Saturation: 84.4 %
TOTAL HEMOGLOBIN: 9.8 g/dL — AB (ref 12.0–16.0)

## 2015-06-28 LAB — MAGNESIUM: Magnesium: 1.2 mg/dL — ABNORMAL LOW (ref 1.7–2.4)

## 2015-06-28 LAB — PROCALCITONIN: PROCALCITONIN: 81.95 ng/mL

## 2015-06-28 LAB — CK: CK TOTAL: 1067 U/L — AB (ref 38–234)

## 2015-06-28 LAB — D-DIMER, QUANTITATIVE: D-Dimer, Quant: 19.57 ug/mL-FEU — ABNORMAL HIGH (ref 0.00–0.50)

## 2015-06-28 LAB — APTT: aPTT: 40 seconds — ABNORMAL HIGH (ref 24–37)

## 2015-06-28 LAB — PHOSPHORUS: PHOSPHORUS: 3.9 mg/dL (ref 2.5–4.6)

## 2015-06-28 LAB — PROTIME-INR
INR: 1.75 — AB (ref 0.00–1.49)
Prothrombin Time: 20.4 seconds — ABNORMAL HIGH (ref 11.6–15.2)

## 2015-06-28 LAB — MRSA PCR SCREENING: MRSA BY PCR: NEGATIVE

## 2015-06-28 MED ORDER — SODIUM CHLORIDE 0.9 % IV SOLN: INTRAVENOUS | Status: DC

## 2015-06-28 MED ORDER — SODIUM CHLORIDE 0.9 % IV BOLUS (SEPSIS)
1000.0000 mL | Freq: Once | INTRAVENOUS | Status: AC
  Administered 2015-06-28: 1000 mL via INTRAVENOUS

## 2015-06-28 MED ORDER — FENTANYL CITRATE (PF) 100 MCG/2ML IJ SOLN
INTRAMUSCULAR | Status: AC | PRN
  Administered 2015-06-28 (×2): 25 ug via INTRAVENOUS

## 2015-06-28 MED ORDER — CHLORHEXIDINE GLUCONATE 0.12 % MT SOLN
15.0000 mL | Freq: Two times a day (BID) | OROMUCOSAL | Status: DC
  Administered 2015-06-29: 15 mL via OROMUCOSAL

## 2015-06-28 MED ORDER — PHENYLEPHRINE HCL 10 MG/ML IJ SOLN
20.0000 ug/min | INTRAVENOUS | Status: DC
  Filled 2015-06-28 (×2): qty 1

## 2015-06-28 MED ORDER — DEXTROSE 5 % IV SOLN
2.0000 g | INTRAVENOUS | Status: DC
  Administered 2015-06-29 – 2015-06-30 (×2): 2 g via INTRAVENOUS
  Filled 2015-06-28 (×3): qty 2

## 2015-06-28 MED ORDER — ONDANSETRON HCL 4 MG/2ML IJ SOLN
4.0000 mg | Freq: Once | INTRAMUSCULAR | Status: AC
  Administered 2015-06-28: 4 mg via INTRAVENOUS
  Filled 2015-06-28: qty 2

## 2015-06-28 MED ORDER — MENTHOL 3 MG MT LOZG: 1.0000 | LOZENGE | OROMUCOSAL | Status: DC | PRN

## 2015-06-28 MED ORDER — LIDOCAINE HCL 1 % IJ SOLN
INTRAMUSCULAR | Status: AC | PRN
  Administered 2015-06-28: 10 mL

## 2015-06-28 MED ORDER — DEXTROSE 5 % IV SOLN: 2.0000 g | Freq: Once | INTRAVENOUS | Status: DC

## 2015-06-28 MED ORDER — PHENYLEPHRINE HCL 10 MG/ML IJ SOLN
0.0000 ug/min | Freq: Once | INTRAMUSCULAR | Status: AC
  Administered 2015-06-28: 50 ug/min via INTRAVENOUS
  Filled 2015-06-28: qty 1

## 2015-06-28 MED ORDER — IOPAMIDOL (ISOVUE-300) INJECTION 61%
10.0000 mL | Freq: Once | INTRAVENOUS | Status: AC | PRN
  Administered 2015-06-28: 10 mL

## 2015-06-28 MED ORDER — NOREPINEPHRINE BITARTRATE 1 MG/ML IV SOLN
5.0000 ug/min | INTRAVENOUS | Status: DC
  Administered 2015-06-28: 5 ug/min via INTRAVENOUS
  Filled 2015-06-28: qty 4

## 2015-06-28 MED ORDER — FENTANYL CITRATE (PF) 100 MCG/2ML IJ SOLN
INTRAMUSCULAR | Status: AC
  Filled 2015-06-28: qty 2

## 2015-06-28 MED ORDER — ONDANSETRON HCL 4 MG/2ML IJ SOLN
4.0000 mg | Freq: Four times a day (QID) | INTRAMUSCULAR | Status: DC | PRN
  Administered 2015-06-28: 4 mg via INTRAVENOUS
  Filled 2015-06-28: qty 2

## 2015-06-28 MED ORDER — CETYLPYRIDINIUM CHLORIDE 0.05 % MT LIQD
7.0000 mL | Freq: Two times a day (BID) | OROMUCOSAL | Status: DC
  Administered 2015-06-28 – 2015-06-29 (×2): 7 mL via OROMUCOSAL

## 2015-06-28 MED ORDER — SODIUM CHLORIDE 0.9 % IV SOLN: 500.0000 mL | INTRAVENOUS | Status: DC | PRN

## 2015-06-28 MED ORDER — LIDOCAINE HCL 1 % IJ SOLN
INTRAMUSCULAR | Status: AC
  Filled 2015-06-28: qty 20

## 2015-06-28 MED ORDER — PHENOL 1.4 % MT LIQD
1.0000 | OROMUCOSAL | Status: DC | PRN
  Filled 2015-06-28: qty 177

## 2015-06-28 MED ORDER — DEXTROSE 5 % IV SOLN: 1.0000 g | INTRAVENOUS | Status: DC

## 2015-06-28 MED ORDER — SODIUM CHLORIDE 0.9 % IV SOLN: 250.0000 mL | INTRAVENOUS | Status: DC | PRN

## 2015-06-28 MED ORDER — NOREPINEPHRINE BITARTRATE 1 MG/ML IV SOLN
5.0000 ug/min | INTRAVENOUS | Status: DC
  Administered 2015-06-28: 25 ug/min via INTRAVENOUS
  Administered 2015-06-29: 15 ug/min via INTRAVENOUS
  Filled 2015-06-28 (×2): qty 16

## 2015-06-28 MED ORDER — CEFTRIAXONE SODIUM 2 G IJ SOLR
2.0000 g | Freq: Once | INTRAMUSCULAR | Status: AC
  Administered 2015-06-28: 2 g via INTRAVENOUS
  Filled 2015-06-28: qty 2

## 2015-06-28 MED ORDER — SODIUM CHLORIDE 0.9 % IV SOLN
Freq: Once | INTRAVENOUS | Status: AC
  Administered 2015-06-28: 13:00:00 via INTRAVENOUS

## 2015-06-28 MED ORDER — SODIUM CHLORIDE 0.9 % IV BOLUS (SEPSIS)
1000.0000 mL | Freq: Once | INTRAVENOUS | Status: AC
Start: 2015-06-28 — End: 2015-06-28
  Administered 2015-06-28: 1000 mL via INTRAVENOUS

## 2015-06-28 NOTE — Procedures (Signed)
Central Venous Catheter Insertion Procedure Note Elvera LennoxMargarita E Blossom [id number] 09/07/1975  Procedure: Insertion of Central Venous Catheter Indications: Assessment of intravascular volume, Drug and/or fluid administration and Frequent blood sampling  Procedure Details Consent: Risks of procedure as well as the alternatives and risks of each were explained to the (patient/caregiver).  Consent for procedure obtained. Time Out: Verified patient identification, verified procedure, site/side was marked, verified correct patient position, special equipment/implants available, medications/allergies/relevent history reviewed, required imaging and test results available.  Performed Real time US was used to ID and cannulate the vessel   Maximum sterile technique was used including antiseptics, cap, gloves, gown, hand hygiene, mask and sheet. Skin prep: Chlorhexidine; local anesthetic administered A antimicrobial bonded/coated triple lumen catheter was placed in the right internal jugular vein using the Seldinger technique.  Evaluation Blood flow good Complications: No apparent complications Patient did tolerate procedure well. Chest X-ray ordered to verify placement.  CXR: pending.  Shelby Mattocksete E Terika Pillard 06/28/2015, 2:16 PM  Simonne MartinetPeter E Darrius Montano ACNP-BC [facility]/Critical Care Pager # [phone] OR # [phone] if no answer

## 2015-06-28 NOTE — Progress Notes (Signed)
Pharmacy Antibiotic Note  Courtney [name] is a 40 y.o. female admitted on 06/28/2015 with UTI.  Pharmacy has been consulted for ceftriaxone dosing.  Plan: Ceftriaxone 2gm x1 ordered by ED then Ceftriaxone 1gm IV q24h Pharmacy will sign off as ceftriaxone does not need renal adjustment  Weight: 127 lb (57.607 kg)  Temp (24hrs), Avg:97.8 F (36.6 C), Min:97.8 F (36.6 C), Max:97.8 F (36.6 C)   Recent Labs Lab 06/26/15 1420  WBC 2.7*  CREATININE 0.88    CrCl cannot be calculated (Unknown ideal weight.).    No Known Allergies  Antimicrobials this admission: 6/20 ceftriaxone >>   Microbiology results: 6/20 BCx: sent 6/20 UCx: ordered    Thank you for allowing pharmacy to be a part of this patient's care.  Courtney [name] RPh 06/28/2015, 10:56 AM Pager [phone]

## 2015-06-28 NOTE — Progress Notes (Signed)
eLink Physician-Brief Progress Note Patient Name: Courtney [name] DOB: [birth year] MRN: [id number]   Date of Service  06/28/2015  HPI/Events of Note  40 yo female admitted to ICU with septic shock and MODS d/t cystitis. R ureteral stone with obstructive uropathy and hydronephrosis. Now s/p percutaneous nephrostomy by IR. Received 6 liters of fluids and currently on Phenylephrine and Norepinephrine IV infusion for hemodynamic support. CVP = 12. Lactic acid clearing 3.4 >> 2.02. Proclacitonin = 81.95.  eICU Interventions  Continue present management.      Intervention Category Evaluation Type: New Patient Evaluation  Lenell AntuSommer,Steven Eugene 06/28/2015, 4:43 PM

## 2015-06-28 NOTE — Progress Notes (Signed)
Pharmacy Brief Note   Pharmacy will concentrate norepinephrine infusion bag due to high rate of infusion.  Pharmacy changed order for norepinephrine 4 mg in 250 mL D5W (single strength) to 16 mg in 250 mL D5W (quadruple strength).  Pharmacy has communicated this change to RN and communicated need to adjust infusion pump settings for quad strength phenylephine.  RN Mitzi Davenport(Shelby) verbalized understanding.  Loralee PacasErin Errol Ala, PharmD, BCPS 06/28/2015 3:55 PM

## 2015-06-28 NOTE — ED Notes (Signed)
Attempted temp foley, unsuccessful with the help of charge nurse and a tech due to patient's anatomy. Informed patient to let us know if she needs to urinate, will notify MD

## 2015-06-28 NOTE — ED Notes (Signed)
Bed: [id number] Expected date:  Expected time:  Means of arrival:  Comments: Low BP

## 2015-06-28 NOTE — ED Notes (Signed)
Due to low O2 sat recommended chest x-ray to MD

## 2015-06-28 NOTE — H&P (Signed)
PULMONARY / CRITICAL CARE MEDICINE   Name: Courtney LennoxMargarita E [name] MRN: [id number] DOB: 05-May-1975    ADMISSION DATE:  06/28/2015 CONSULTATION DATE:  6/20  REFERRING MD:  Jodelle Grosszakowski   CHIEF COMPLAINT:  Septic shock   HISTORY OF PRESENT ILLNESS:   40 year old female who was in usual state of health until 6/18 when she began to have right flank pain, nausea and vomiting. Went to the ER. Dx eval was c/w mild dehydration and right ureteral stone w/ associated obstructive uropathy. She was sent home w/ instructions to push PO fluids, and was given scripts for: Ibuprofen, zofran, tamsulosin, and vicodin. She spent most of the following day in bed w/ fevers, chills, generalized myalgias. She had been made a referral to Urology and went to this work in appointment on 6/20. On arrival she was found to be toxic, acutely ill appearing and hypotensive. She was sent to the ED for further evaluation. In the ER: cultures were drawn, she received 5 liters of crystalloid, and empiric rocephin. Her initial lactic acid was 3.41 to 2.02 after fluid resuscitation. Her initial scr was 4.53 (which was 0.88 just 2 days prior). PCCM was asked to see for septic shock. Central access was obtained, vasoactive gtts initiated and both IR and Urology teams were mobilized.   PAST MEDICAL HISTORY :  She  has no past medical history on file.  PAST SURGICAL HISTORY: She  has past surgical history that includes Tubal ligation (10/29/2004).  No Known Allergies  No current facility-administered medications on file prior to encounter.   Current Outpatient Prescriptions on File Prior to Encounter  Medication Sig  . ibuprofen (ADVIL,MOTRIN) 600 MG tablet Take 1 tablet (600 mg total) by mouth every 6 (six) hours as needed. (Patient taking differently: Take 600 mg by mouth every 6 (six) hours as needed for moderate pain. )  . ondansetron (ZOFRAN ODT) 8 MG disintegrating tablet Take 1 tablet (8 mg total) by mouth every 8 (eight) hours as  needed for nausea or vomiting.  Marland Kitchen. oxyCODONE-acetaminophen (PERCOCET) 5-325 MG tablet Take 1-2 tablets by mouth every 4 (four) hours as needed for severe pain.  . tamsulosin (FLOMAX) 0.4 MG CAPS capsule Take 1 capsule (0.4 mg total) by mouth daily.    FAMILY HISTORY:  Her has no family status information on file.   SOCIAL HISTORY: She  reports that she has never smoked. She has never used smokeless tobacco. She reports that she drinks alcohol. She reports that she does not use illicit drugs.  REVIEW OF SYSTEMS:   General: + chills, feels sick, pain in all joints. HENT: No HA, mouth dry, no nasal congestion or sore throat. Card: no CP, + dizziness. No palps. Pulm> no cough, SOB, wheeze. Abd: +N/V, no diarrhea, poor appetite. GU: + right flank pain, decreased UOP Muscular: diffuse pain, all extremities. Neuro: Diffuse pain all joints. + dizziness.   SUBJECTIVE:  Pain all over   VITAL SIGNS: BP 92/39 mmHg  Pulse 123  Temp(Src) 96.5 F (35.8 C) (Rectal)  Resp 27  Wt 127 lb (57.607 kg)  SpO2 93%  LMP 06/28/2015  HEMODYNAMICS:    VENTILATOR SETTINGS:    INTAKE / OUTPUT:    PHYSICAL EXAMINATION: General:  Acutely ill 40 year old female, shivering, anxious, acutely ill and toxic appearing  Neuro:  Awake, alert, she has no focal def. She does have diffuse pain t/o  HEENT:  Lips are dry and cracked, MM are dry Neck veins are flat  Cardiovascular:  Tachy RRR w/out MRG  Lungs:  Clear, tachypnic, no accessory use  Abdomen:  Soft, not tender. + bowel sounds Musculoskeletal:  Equal st and bulk  Skin:  Warm, flushed, rapid pulses. Brisk CR   LABS:  BMET  Recent Labs Lab 06/26/15 1420 06/28/15 1045  NA 136 129*  K 3.3* 5.1  CL 107 97*  CO2 24 19*  BUN 10 53*  CREATININE 0.88 4.53*  GLUCOSE 137* 83    Electrolytes  Recent Labs Lab 06/26/15 1420 06/28/15 1045  CALCIUM 8.9 6.8*    CBC  Recent Labs Lab 06/26/15 1420 06/28/15 1045  WBC 2.7* 14.0*  HGB 12.2 9.8*   HCT 37.2 28.3*  PLT 155 51*    Coag's No results for input(s): APTT, INR in the last 168 hours.  Sepsis Markers  Recent Labs Lab 06/28/15 1059 06/28/15 1329  LATICACIDVEN 3.41* 2.02*    ABG No results for input(s): PHART, PCO2ART, PO2ART in the last 168 hours.  Liver Enzymes  Recent Labs Lab 06/26/15 1420 06/28/15 1045  AST 19 63*  ALT 12* 32  ALKPHOS 44 72  BILITOT 1.6* 1.1  ALBUMIN 4.3 3.4*    Cardiac Enzymes No results for input(s): TROPONINI, PROBNP in the last 168 hours.  Glucose No results for input(s): GLUCAP in the last 168 hours.  Imaging Dg Chest Port 1 View  06/28/2015  CLINICAL DATA:  Hypotension.  Weakness and fatigue. EXAM: PORTABLE CHEST 1 VIEW COMPARISON:  06/26/2015 CT scan FINDINGS: Mild enlargement of the cardiopericardial silhouette with indistinct upper zone pulmonary vasculature. Lungs appear otherwise clear. No pleural effusion.  No significant bony abnormality observed. IMPRESSION: 1. Mild enlargement of the cardiopericardial silhouette with suspicion for pulmonary venous hypertension. No overt edema. Electronically Signed   By: Gaylyn Rong M.D.   On: 06/28/2015 12:56     STUDIES:  CT abd/pelvis 6/18 (prior to admit): Obstructing 6 mm distal right ureteral stone with evidence of moderate to severe right obstructive uropathy.  CULTURES: BCX2 6/20>>> UC 6/20>>>  ANTIBIOTICS: Rocephin 6/20>>>  SIGNIFICANT EVENTS:   LINES/TUBES: Right IJ CVL 6/20>>>  DISCUSSION: Septic shock in setting of Obstructive Uropathy d/t right Ureteral stone. She has had aggressive volume resuscitation, IV antibiotics, and central access has been placed w/ Neo and now Levophed infusing. Both IR and Urology teams have been mobilized. She will go emergently for Perc tube placement and then directly admitted to the intensive care.   ASSESSMENT / PLAN:  PULMONARY A: Tachypnea  P:   Trend O2 sats Supplemental Oxygen Central access to eval volume  status   CARDIOVASCULAR A:  SIRS/septic shock/MODS P:  Start EGDT protocol (has received: IVFs, abx, central access placed all in ED) To IR for perc drain Admit to ICU  RENAL A:   Right ureteral stone w/ associated  Obstructive uropathy/ Hydronephrosis  Acute Renal Failure: in setting of dehydration, sepsis and possibly NSAIDs Lactic acidosis-->responding to volume resuscitation Hyponatremia   P:   Volume resuscitation  MAP goal >65 Hold antihypertensives IR and Urology consulted-->she will need Perc drain   GASTROINTESTINAL A:   Nausea and vomiting  P:   NPO   HEMATOLOGIC A:   Severe sepsis related thrombocytopenia  P:  hgb goal > 7 Trend CBC PAS to LEs   INFECTIOUS A:   Septic Shock in setting of UT source.  P:   See CV section  IV rocephin started 6/20>>> PCT algo F/u cultures Source control-->IR team mobilized for Perc drain   ENDOCRINE  A:   No acute  P:   Trend glucose   NEUROLOGIC A:   Pain-->has significant diffuse myalgia; her right flank discomfort has improved-->likely from sepsis P:   RASS goal: 0 Will start low dose fent after hemodynamics improved.    FAMILY  - Updates:   - Inter-disciplinary family meet or Palliative Care meeting due by:  6/27   45 minutes PCCM time Simonne Martinet ACNP-BC [facility] Pulmonary/Critical Care Pager # [phone] OR # [phone] if no answer   06/28/2015, 1:38 PM

## 2015-06-28 NOTE — Progress Notes (Signed)
eLink Physician-Brief Progress Note Patient Name: Courtney LennoxMargarita E Robb DOB: 03-Jul-1975 MRN: [id number]   Date of Service  06/28/2015  HPI/Events of Note  Lactic Acid = 2.02 >> 2.6. CVP = 1-2.  eICU Interventions  Will bolus with 0.9 NaCl 1 liter IV over 1 hour now.      Intervention Category Major Interventions: Acid-Base disturbance - evaluation and management  Sommer,Steven Eugene 06/28/2015, 8:41 PM

## 2015-06-28 NOTE — Progress Notes (Signed)
[facility] critical care medicine  I have seen and examined the patient with Courtney [name] and I agree with the findings from his note.  Briefly, this is a 40 year old female who was diagnosed with a renal stone 2 days ago. She was sent to urology today but in the interim she's developed fever, chills and was noted to be profoundly hypotensive at the urology office today. She was transferred to the emergency department for further management.  On exam: Acutely ill-appearing Lungs clear to auscultation with normal effort Tachycardic, regular rhythm Belly soft, nontender  Labs reviewed leukocytosis, acute kidney injury, thrombocytopenia, lactate 3.6  Impression: Septic shock Pyelonephritis Nephrolithiasis  Plan: Continue aggressive resuscitation with IV fluids, monitor CVP Continue broad-spectrum antibiotics Continue vasopressors titrated to mean arterial pressure greater than 65 Cycle lactic acid The patient is to go for interventional radiology guided decompression/nephrostomy drain immediately, I have discussed this with urology who agrees with this plan.  Has been updated by me  CC time 35 minutes  Heber CarolinaBrent Renesmee Raine, MD [place] Pager: [phone] Cell: [phone] After 3pm or if no response, call [phone]

## 2015-06-28 NOTE — Consult Note (Signed)
Urology Consult   Physician requesting consult: [name]  Reason for consult: 6mm obstructing right distal ureteral stone with sepsis  History of Present Illness: Courtney [name] is a 40 y.o. female with no significant PMH who was evaled in the ED 2 days ago for right flank pain.  She was found to have a 6mm obstructing distal right ureteral stone with mild-moderate hydroureteronephrosis.  Her pain was well controlled and she was AF with stable vitals.  She was discharged on zofran, percocet, and flomax with instructions to f/u with AUS.  Pt presented to our office earlier today with complaints of N/V since Sunday night, whole body aches, and fatigue.  She was found to be lethargic, hypotensive, and tachycardic.  UA in office appears infected.  Pt was brought to ED via EMS.      WBC is 14 (2.7 on 06/26/15).  Cr is 4.5 (0.88 on 6/18/).  Plt 51 (155 on 6/18)  Pt is currently resting in the ED.  She is awake and alert.  She states her "whole body hurts".  She denies F/C, CP, SOB, and difficulty voiding.  She started her menstrual cycle today.    History reviewed. No pertinent past medical history.  Past Surgical History  Procedure Laterality Date  . Tubal ligation  10/29/2004     Current Hospital Medications:  Home meds:    Medication List    ASK your doctor about these medications        ibuprofen 600 MG tablet  Commonly known as:  ADVIL,MOTRIN  Take 1 tablet (600 mg total) by mouth every 6 (six) hours as needed.     ondansetron 8 MG disintegrating tablet  Commonly known as:  ZOFRAN ODT  Take 1 tablet (8 mg total) by mouth every 8 (eight) hours as needed for nausea or vomiting.     oxyCODONE-acetaminophen 5-325 MG tablet  Commonly known as:  PERCOCET  Take 1-2 tablets by mouth every 4 (four) hours as needed for severe pain.     tamsulosin 0.4 MG Caps capsule  Commonly known as:  FLOMAX  Take 1 capsule (0.4 mg total) by mouth daily.        Scheduled Meds:   Continuous Infusions: . sodium chloride    . sodium chloride    . sodium chloride    . sodium chloride    . [START ON 06/29/2015] cefTRIAXone (ROCEPHIN)  IV    . cefTRIAXone (ROCEPHIN)  IV    . norepinephrine (LEVOPHED) Adult infusion 5 mcg/min (06/28/15 1403)  . phenylephrine (NEO-SYNEPHRINE) Adult infusion     PRN Meds:.sodium chloride, sodium chloride, ondansetron (ZOFRAN) IV  Allergies: No Known Allergies  Family History  Problem Relation Age of Onset  . Cancer Mother     Breast  . Lung disease Father     Social History:  reports that she has never smoked. She has never used smokeless tobacco. She reports that she drinks alcohol. She reports that she does not use illicit drugs.  ROS: A complete review of systems was performed.  All systems are negative except for pertinent findings as noted.  Physical Exam:  Vital signs in last 24 hours: Temp:  [96.5 F (35.8 C)-97.8 F (36.6 C)] 96.5 F (35.8 C) (06/20 1331) Pulse Rate:  [103-127] 123 (06/20 1331) Resp:  [14-30] 27 (06/20 1331) BP: (68-92)/(39-51) 92/39 mmHg (06/20 1331) SpO2:  [91 %-98 %] 93 % (06/20 1331) Weight:  [57.607 kg (127 lb)] 57.607 kg (127 lb) (06/20 1048) Constitutional:  Alert and oriented, No acute distress; lethargic Cardiovascular: Regular rhythm; tachy Respiratory: Normal respiratory effort GI: Abdomen is soft, tender in lower quadrants, nondistended, no abdominal masses GU: CVA tenderness Lymphatic: No lymphadenopathy Neurologic: Grossly intact, no focal deficits Psychiatric: Normal mood and affect  Laboratory Data:   Recent Labs  06/26/15 1420 06/28/15 1045  WBC 2.7* 14.0*  HGB 12.2 9.8*  HCT 37.2 28.3*  PLT 155 51*     Recent Labs  06/26/15 1420 06/28/15 1045  NA 136 129*  K 3.3* 5.1  CL 107 97*  GLUCOSE 137* 83  BUN 10 53*  CALCIUM 8.9 6.8*  CREATININE 0.88 4.53*     Results for orders placed or performed during the hospital encounter of 06/28/15 (from the past 24  hour(s))  Comprehensive metabolic panel     Status: Abnormal   Collection Time: 06/28/15 10:45 AM  Result Value Ref Range   Sodium 129 (L) 135 - 145 mmol/L   Potassium 5.1 3.5 - 5.1 mmol/L   Chloride 97 (L) 101 - 111 mmol/L   CO2 19 (L) 22 - 32 mmol/L   Glucose, Bld 83 65 - 99 mg/dL   BUN 53 (H) 6 - 20 mg/dL   Creatinine, Ser 1.614.53 (H) 0.44 - 1.00 mg/dL   Calcium 6.8 (L) 8.9 - 10.3 mg/dL   Total Protein 5.5 (L) 6.5 - 8.1 g/dL   Albumin 3.4 (L) 3.5 - 5.0 g/dL   AST 63 (H) 15 - 41 U/L   ALT 32 14 - 54 U/L   Alkaline Phosphatase 72 38 - 126 U/L   Total Bilirubin 1.1 0.3 - 1.2 mg/dL   GFR calc non Af Amer 11 (L) >60 mL/min   GFR calc Af Amer 13 (L) >60 mL/min   Anion gap 13 5 - 15  CBC WITH DIFFERENTIAL     Status: Abnormal   Collection Time: 06/28/15 10:45 AM  Result Value Ref Range   WBC 14.0 (H) 4.0 - 10.5 K/uL   RBC 3.40 (L) 3.87 - 5.11 MIL/uL   Hemoglobin 9.8 (L) 12.0 - 15.0 g/dL   HCT 09.628.3 (L) 04.536.0 - 40.946.0 %   MCV 83.2 78.0 - 100.0 fL   MCH 28.8 26.0 - 34.0 pg   MCHC 34.6 30.0 - 36.0 g/dL   RDW 81.112.9 91.411.5 - 78.215.5 %   Platelets 51 (L) 150 - 400 K/uL   Neutrophils Relative % 93 %   Lymphocytes Relative 3 %   Monocytes Relative 3 %   Eosinophils Relative 1 %   Basophils Relative 0 %   Neutro Abs 13.1 (H) 1.7 - 7.7 K/uL   Lymphs Abs 0.4 (L) 0.7 - 4.0 K/uL   Monocytes Absolute 0.4 0.1 - 1.0 K/uL   Eosinophils Absolute 0.1 0.0 - 0.7 K/uL   Basophils Absolute 0.0 0.0 - 0.1 K/uL   WBC Morphology MILD LEFT SHIFT (1-5% METAS, OCC MYELO, OCC BANDS)   I-Stat beta hCG blood, ED     Status: None   Collection Time: 06/28/15 10:58 AM  Result Value Ref Range   I-stat hCG, quantitative <5.0 <5 mIU/mL   Comment 3          I-Stat CG4 Lactic Acid, ED  (not at  [facility])     Status: Abnormal   Collection Time: 06/28/15 10:59 AM  Result Value Ref Range   Lactic Acid, Venous 3.41 (HH) 0.5 - 2.0 mmol/L   Comment NOTIFIED PHYSICIAN   I-Stat CG4 Lactic Acid, ED  (not at  [facility])     Status:  Abnormal   Collection Time: 06/28/15  1:29 PM  Result Value Ref Range   Lactic Acid, Venous 2.02 (HH) 0.5 - 2.0 mmol/L   Comment NOTIFIED PHYSICIAN    No results found for this or any previous visit (from the past 240 hour(s)).  Renal Function:  Recent Labs  06/26/15 1420 06/28/15 1045  CREATININE 0.88 4.53*   CrCl cannot be calculated (Unknown ideal weight.).  Radiologic Imaging: Ct Abdomen Pelvis W Contrast  06/26/2015  CLINICAL DATA:  Right-sided flank pain.  Vomiting. EXAM: CT ABDOMEN AND PELVIS WITH CONTRAST TECHNIQUE: Multidetector CT imaging of the abdomen and pelvis was performed using the standard protocol following bolus administration of intravenous contrast. CONTRAST:  ISOVUE-300 IOPAMIDOL (ISOVUE-300) INJECTION 61% COMPARISON:  Abdominal ultrasound 10/20/2004 FINDINGS: Lower chest:  No acute findings. Hepatobiliary: No masses or other significant abnormality. Pancreas: No mass, inflammatory changes, or other significant abnormality. Spleen: Within normal limits in size and appearance. Adrenals/Urinary Tract: There is an obstructing 6 mm distal right ureteral stone slightly proximal to the vesicoureteral junction. There is a marked upstream right ureteral dilation with moderate right hydronephrosis. Right perirenal inflammatory fat stranding and small amount of free fluid are seen in the perirenal space. Other nonobstructing right renal calculi are seen in the lower pole of the right kidney measuring up to 4 mm. There is a tiny 1-2 mm nonobstructing calculus within the superior pole of the left kidney. The left kidney is otherwise normal. The urinary bladder is poorly visualized as it is decompressed. Stomach/Bowel: No evidence of obstruction, inflammatory process, or abnormal fluid collections. Vascular/Lymphatic: No pathologically enlarged lymph nodes. No evidence of abdominal aortic aneurysm. Reproductive: No mass or other significant abnormality. Other: None.  Musculoskeletal:  No suspicious bone lesions identified. IMPRESSION: Obstructing 6 mm distal right ureteral stone with evidence of moderate to severe right obstructive uropathy. Additional bilateral nonobstructing renal calculi. Electronically Signed   By: Ted Mcalpine M.D.   On: 06/26/2015 16:28   Dg Chest Port 1 View  06/28/2015  CLINICAL DATA:  Hypotension.  Weakness and fatigue. EXAM: PORTABLE CHEST 1 VIEW COMPARISON:  06/26/2015 CT scan FINDINGS: Mild enlargement of the cardiopericardial silhouette with indistinct upper zone pulmonary vasculature. Lungs appear otherwise clear. No pleural effusion.  No significant bony abnormality observed. IMPRESSION: 1. Mild enlargement of the cardiopericardial silhouette with suspicion for pulmonary venous hypertension. No overt edema. Electronically Signed   By: Gaylyn Rong M.D.   On: 06/28/2015 12:56   Foley placement: Area was prepped and draped in sterile fashion.  30f foley was easily placed with immediate return of 300cc cloudy dark yellow urine.  Foley balloon inflated with 10cc sterile saline.  Foley placed in luer lock on left leg.  Pt tolerated well.   Impression/Recommendation:  Sepsis due to UTI and 6mm obstructing distal right ureteral stone--pt will have right perc nephrostomy performed by IR today as she is not stable enough to go to OR for surgical intervention.  If she does not pass the stone it will be addressed at a later date when she is more stable.  Send cath urine for culture. She was started on Rocephin in the ED.  Continue broad spectrum Abx until culture results avail for review and then adjust as necessary.  Acute kidney injury should improve with placement of perc/appropriate drainage of kidney, tx of infection, and resolution of dehydration.     All medical issues per PCCM.   DANCY, AMANDA 06/28/2015, 2:07  PM   Urology Attending Note: HP as above. Pt now with Riht perc nephrostomy tube. Sepsis. Will eventually need  stone extraction if she does not pass the stone electively.

## 2015-06-28 NOTE — Procedures (Signed)
Interventional Radiology Procedure Note  Procedure: Placement of a right percutaneous nephrostomy 65F pigtail via lower pole calyx.  Complications: None Recommendations:  - Draining. - Sample sent to lab - Do not submerge  - Agree with ICU care   Signed,  Yvone NeuJaime S. Loreta AveWagner, DO

## 2015-06-28 NOTE — ED Notes (Signed)
Pt going to IR at this time. Calling report to ICU charge shortly

## 2015-06-28 NOTE — ED Notes (Signed)
Gave report to ICU nurse

## 2015-06-28 NOTE — ED Notes (Signed)
Per EMS, from urology center, recently D/C from there for a kidney stone. Has been taking tamsulosin, oxycodone, and zofran since with a darkening in her urine since taking the meds. Has become increasingly weak and tired, with generalized body aches and chills. EMS was called d/t hypotension and tachycardia, the urologist office noticed WBC and blood in urine, pt currently on menstrual cycle. C/o N/V over last couple days also.

## 2015-06-28 NOTE — ED Notes (Signed)
Intensivist MD at bedside. Xray at bedside

## 2015-06-28 NOTE — ED Notes (Signed)
Intensivist Zenia ResidesPeter Babcock at bedside to place central line. Site cleaned with betadine. Queen Sloughodd C. Emergency department tech, myself, and husband at bedside with signed consent.

## 2015-06-28 NOTE — ED Provider Notes (Addendum)
CSN: [id number]     Arrival date & time 06/28/15  1016 History   First MD Initiated Contact with Patient 06/28/15 1036     Chief Complaint  Patient presents with  . Hypotension     (Consider location/radiation/quality/duration/timing/severity/associated sxs/prior Treatment) The history is provided by the patient, the spouse and a relative.  40 year old female [facility] ago with diagnosis of large obstructing right ureteral stone. Patient had follow-up with urology today. On checking in there was noted to be hypotensive so referred here. Patient states that she has not had any specific abdominal pain but has body aches all over and chills. There is been intermittent nausea and vomiting. No diarrhea. Not able to confirm fevers.  History reviewed. No pertinent past medical history. Past Surgical History  Procedure Laterality Date  . Tubal ligation  10/29/2004   Family History  Problem Relation Age of Onset  . Cancer Mother     Breast  . Lung disease Father    Social History  Substance Use Topics  . Smoking status: Never Smoker   . Smokeless tobacco: Never Used  . Alcohol Use: 0.0 oz/week    0 Standard drinks or equivalent per week     Comment: rare   OB History    No data available     Review of Systems  Constitutional: Positive for chills and fatigue.  HENT: Negative for congestion.   Eyes: Negative for visual disturbance.  Respiratory: Negative for shortness of breath.   Cardiovascular: Negative for chest pain.  Gastrointestinal: Positive for nausea and vomiting. Negative for abdominal pain and diarrhea.  Genitourinary: Positive for vaginal bleeding.  Musculoskeletal: Positive for myalgias.  Skin: Negative for rash.  Neurological: Negative for headaches.  Hematological: Does not bruise/bleed easily.  Psychiatric/Behavioral: Negative for confusion.      Allergies  Review of patient's allergies indicates no known allergies.  Home Medications   Prior to  Admission medications   Medication Sig Start Date End Date Taking? Authorizing Provider  ibuprofen (ADVIL,MOTRIN) 600 MG tablet Take 1 tablet (600 mg total) by mouth every 6 (six) hours as needed. Patient taking differently: Take 600 mg by mouth every 6 (six) hours as needed for moderate pain.  06/26/15  Yes Tatyana Kirichenko, PA-C  ondansetron (ZOFRAN ODT) 8 MG disintegrating tablet Take 1 tablet (8 mg total) by mouth every 8 (eight) hours as needed for nausea or vomiting. 06/26/15  Yes Tatyana Kirichenko, PA-C  oxyCODONE-acetaminophen (PERCOCET) 5-325 MG tablet Take 1-2 tablets by mouth every 4 (four) hours as needed for severe pain. 06/26/15  Yes Tatyana Kirichenko, PA-C  tamsulosin (FLOMAX) 0.4 MG CAPS capsule Take 1 capsule (0.4 mg total) by mouth daily. 06/26/15  Yes Tatyana Kirichenko, PA-C   BP 92/39 mmHg  Pulse 123  Temp(Src) 96.5 F (35.8 C) (Rectal)  Resp 27  Wt 57.607 kg  SpO2 93%  LMP 06/28/2015 Physical Exam  Constitutional: She is oriented to person, place, and time. She appears well-developed and well-nourished. No distress.  HENT:  Head: Normocephalic and atraumatic.  Lips and mucous membranes dry.  Eyes: Conjunctivae and EOM are normal. Pupils are equal, round, and reactive to light.  Neck: Normal range of motion. Neck supple.  Cardiovascular: Regular rhythm and normal heart sounds.   Tachycardic  Pulmonary/Chest: Effort normal and breath sounds normal. No respiratory distress.  Abdominal: Soft. Bowel sounds are normal. There is no rebound.  Musculoskeletal: Normal range of motion. She exhibits no edema.  Neurological: She is alert  and oriented to person, place, and time. No cranial nerve deficit. She exhibits normal muscle tone. Coordination normal.  Skin: No rash noted. No erythema.  Nursing note and vitals reviewed.   ED Course  Procedures (including critical care time) Labs Review Labs Reviewed  COMPREHENSIVE METABOLIC PANEL - Abnormal; Notable for the  following:    Sodium 129 (*)    Chloride 97 (*)    CO2 19 (*)    BUN 53 (*)    Creatinine, Ser 4.53 (*)    Calcium 6.8 (*)    Total Protein 5.5 (*)    Albumin 3.4 (*)    AST 63 (*)    GFR calc non Af Amer 11 (*)    GFR calc Af Amer 13 (*)    All other components within normal limits  CBC WITH DIFFERENTIAL/PLATELET - Abnormal; Notable for the following:    WBC 14.0 (*)    RBC 3.40 (*)    Hemoglobin 9.8 (*)    HCT 28.3 (*)    Platelets 51 (*)    Neutro Abs 13.1 (*)    Lymphs Abs 0.4 (*)    All other components within normal limits  I-STAT CG4 LACTIC ACID, ED - Abnormal; Notable for the following:    Lactic Acid, Venous 3.41 (*)    All other components within normal limits  I-STAT CG4 LACTIC ACID, ED - Abnormal; Notable for the following:    Lactic Acid, Venous 2.02 (*)    All other components within normal limits  CULTURE, BLOOD (ROUTINE X 2)  CULTURE, BLOOD (ROUTINE X 2)  URINE CULTURE  URINALYSIS, ROUTINE W REFLEX MICROSCOPIC (NOT AT [facility])  CK  BASIC METABOLIC PANEL  MAGNESIUM  PHOSPHORUS  LACTIC ACID, PLASMA  PROCALCITONIN  PROTIME-INR  APTT  BLOOD GAS, ARTERIAL  CORTISOL  D-DIMER, QUANTITATIVE (NOT AT [facility])  URINALYSIS, ROUTINE W REFLEX MICROSCOPIC (NOT AT [facility])  I-STAT BETA HCG BLOOD, ED ([facility], [facility], [facility] ONLY)  I-STAT CG4 LACTIC ACID, ED    Imaging Review Ct Abdomen Pelvis W Contrast  06/26/2015  CLINICAL DATA:  Right-sided flank pain.  Vomiting. EXAM: CT ABDOMEN AND PELVIS WITH CONTRAST TECHNIQUE: Multidetector CT imaging of the abdomen and pelvis was performed using the standard protocol following bolus administration of intravenous contrast. CONTRAST:  ISOVUE-300 IOPAMIDOL (ISOVUE-300) INJECTION 61% COMPARISON:  Abdominal ultrasound 10/20/2004 FINDINGS: Lower chest:  No acute findings. Hepatobiliary: No masses or other significant abnormality. Pancreas: No mass, inflammatory changes, or other significant abnormality. Spleen: Within normal limits in size and  appearance. Adrenals/Urinary Tract: There is an obstructing 6 mm distal right ureteral stone slightly proximal to the vesicoureteral junction. There is a marked upstream right ureteral dilation with moderate right hydronephrosis. Right perirenal inflammatory fat stranding and small amount of free fluid are seen in the perirenal space. Other nonobstructing right renal calculi are seen in the lower pole of the right kidney measuring up to 4 mm. There is a tiny 1-2 mm nonobstructing calculus within the superior pole of the left kidney. The left kidney is otherwise normal. The urinary bladder is poorly visualized as it is decompressed. Stomach/Bowel: No evidence of obstruction, inflammatory process, or abnormal fluid collections. Vascular/Lymphatic: No pathologically enlarged lymph nodes. No evidence of abdominal aortic aneurysm. Reproductive: No mass or other significant abnormality. Other: None. Musculoskeletal:  No suspicious bone lesions identified. IMPRESSION: Obstructing 6 mm distal right ureteral stone with evidence of moderate to severe right obstructive uropathy. Additional bilateral nonobstructing renal calculi. Electronically Signed  By: Ted Mcalpine M.D.   On: 06/26/2015 16:28   Dg Chest Port 1 View  06/28/2015  CLINICAL DATA:  Hypotension.  Weakness and fatigue. EXAM: PORTABLE CHEST 1 VIEW COMPARISON:  06/26/2015 CT scan FINDINGS: Mild enlargement of the cardiopericardial silhouette with indistinct upper zone pulmonary vasculature. Lungs appear otherwise clear. No pleural effusion.  No significant bony abnormality observed. IMPRESSION: 1. Mild enlargement of the cardiopericardial silhouette with suspicion for pulmonary venous hypertension. No overt edema. Electronically Signed   By: Gaylyn Rong M.D.   On: 06/28/2015 12:56   I have personally reviewed and evaluated these images and lab results as part of my medical decision-making.   EKG Interpretation   Date/Time:  Tuesday June 28 2015 10:48:26 EDT Ventricular Rate:  102 PR Interval:    QRS Duration: 78 QT Interval:  330 QTC Calculation: 430 R Axis:   72 Text Interpretation:  Sinus tachycardia Low voltage, extremity leads  Minimal ST depression, anterior leads No previous ECGs available Confirmed  by Darick Fetters  MD, [facility] [place] [phone]) on 06/28/2015 1:58:05 PM      CRITICAL CARE Performed by: [name] Total critical care time: 60 minutes Critical care time was exclusive of separately billable procedures and treating other patients. Critical care was necessary to treat or prevent imminent or life-threatening deterioration. Critical care was time spent personally by me on the following activities: development of treatment plan with patient and/or surrogate as well as nursing, discussions with consultants, evaluation of patient's response to treatment, examination of patient, obtaining history from patient or surrogate, ordering and performing treatments and interventions, ordering and review of laboratory studies, ordering and review of radiographic studies, pulse oximetry and re-evaluation of patient's condition.     MDM   Final diagnoses:  Sepsis, due to unspecified organism [facility] [place])  Kidney stone    Patient with diagnosis of right-sided obstructing kidney stone 2 days ago at [facility]. Patient had urology follow-up today. They noted that she was hypotensive. Patient referred to the emergency department for further evaluation. Dr. Patsi Sears from urology made aware.  Patient started on septic protocol. Was not febrile but was tachycardic and hypotensive. Patient without any specific right flank pain but had chills and generalized body aches. Patient clinically looked very dehydrated lips were chapped. Patient based on religious following has been fasting.  Patient BUN and creatinine were markedly elevated compared to days ago. Unable to get Foley catheter in. Urine not rechecked today. Presumed sepsis.  Started on sepsis protocol. Patient based on her weight was to receive 2 L of fluid with it made no difference in her blood pressure. Since the BUN and creatinine were so high but may be of 2 more liters would make a difference. Patient received a total of 5 L without a significant improvement in blood pressure. Neo-Synephrine was started. Critical care was called.  Critical care started central line and starting of levo fed. Urology updated.  Patient will be admitted by critical care. Patient's lactic acid was elevated. With fluids lactic acid went from 3-1/2 down to to something. Showing some signs of improvement. Patient status was stable throughout the visit.  The presumed source has to do with the kidney. Chest x-ray negative. Patient started on antibiotics consistent with kidney source. Patient received Rocephin.   [name], MD 06/28/15 6045  [name], MD 06/28/15 1404  [name], MD 06/28/15 [id number]

## 2015-06-28 NOTE — ED Notes (Signed)
Lactic 3.41 Vanetta MuldersScott Zackowski, MD is aware

## 2015-06-28 NOTE — Consult Note (Signed)
Chief Complaint: Right sided hydronephrosis secondary to obstructed right ureter  Referring Physician: Dr. Carolan Clines  Supervising Physician: Corrie Mckusick  Patient Status: In-pt   HPI: Courtney [name] is an 40 y.o. female who presented to the emergency department on June 18 secondary to right flank pain with associated nausea and vomiting. She was diagnosed with mild dehydration and a right ureteral stone with associated obstructive uropathy.  She was discharged on Flomax as well as ibuprofen and pain medicine. She was informed to increase her oral intake. However she continued to have persistent nausea and vomiting and was unable to keep anything down. She began running fevers at home with chills. She had overall generalized malaise. She was referred to urology today for follow-up. Upon arrival she was noted to have a systolic blood pressure in the 70s. She was sent to the emergency department for further evaluation. Upon arrival she was noted to have a serum creatinine of 4.53 which was normal just 2 days ago. She was also felt to be in septic shock and critical care medicine was consulted. Currently, the patient's blood pressure remained systolic in the 58I on Neo-Synephrine. Levophed is getting ready to be added.  She is felt to be septic at this time from a right ureteral obstruction. We have been asked to emergently evaluate the patient for nephrostomy tube placement.  Past Medical History: History reviewed. No pertinent past medical history.  Past Surgical History:  Past Surgical History  Procedure Laterality Date  . Tubal ligation  10/29/2004    Family History:  Family History  Problem Relation Age of Onset  . Cancer Mother     Breast  . Lung disease Father     Social History:  reports that she has never smoked. She has never used smokeless tobacco. She reports that she drinks alcohol. She reports that she does not use illicit drugs.  Allergies: No Known  Allergies  Medications:   Medication List    ASK your doctor about these medications        ibuprofen 600 MG tablet  Commonly known as:  ADVIL,MOTRIN  Take 1 tablet (600 mg total) by mouth every 6 (six) hours as needed.     ondansetron 8 MG disintegrating tablet  Commonly known as:  ZOFRAN ODT  Take 1 tablet (8 mg total) by mouth every 8 (eight) hours as needed for nausea or vomiting.     oxyCODONE-acetaminophen 5-325 MG tablet  Commonly known as:  PERCOCET  Take 1-2 tablets by mouth every 4 (four) hours as needed for severe pain.     tamsulosin 0.4 MG Caps capsule  Commonly known as:  FLOMAX  Take 1 capsule (0.4 mg total) by mouth daily.        Please HPI for pertinent positives, otherwise complete 10 system ROS negative.  Mallampati Score: MD Evaluation Airway: WNL Heart: WNL Abdomen: WNL Chest/ Lungs: WNL ASA  Classification: 4 Mallampati/Airway Score: Two  Physical Exam: BP 78/50 mmHg  Pulse 116  Temp(Src) 96.5 F (35.8 C) (Rectal)  Resp 25  Wt 127 lb (57.607 kg)  SpO2 94%  LMP 06/28/2015 There is no height on file to calculate BMI. General: pleasant, ill-appearing Hispanic female who is laying in bed in NAD from pain. HEENT: head is normocephalic, atraumatic.  Sclera are noninjected.  PERRL.  Ears and nose without any masses or lesions.  Mouth is pink but dry. Heart: Regular rhythm but tachycardic.  Normal s1,s2. No obvious murmurs, gallops, or  rubs noted.  Palpable radial and pedal pulses bilaterally Lungs: CTAB, no wheezes, rhonchi, or rales noted.  Respiratory effort nonlabored Abd: soft, mildly tender, ND, no active BS, no masses, hernias, or organomegaly.  She does have right CVA tenderness. MS: all 4 extremities are symmetrical with no cyanosis, clubbing, or edema. Psych: A&Ox3 with an appropriate affect.   Labs: Results for orders placed or performed during the hospital encounter of 06/28/15 (from the past 48 hour(s))  Comprehensive metabolic  panel     Status: Abnormal   Collection Time: 06/28/15 10:45 AM  Result Value Ref Range   Sodium 129 (L) 135 - 145 mmol/L   Potassium 5.1 3.5 - 5.1 mmol/L   Chloride 97 (L) 101 - 111 mmol/L   CO2 19 (L) 22 - 32 mmol/L   Glucose, Bld 83 65 - 99 mg/dL   BUN 53 (H) 6 - 20 mg/dL   Creatinine, Ser 4.53 (H) 0.44 - 1.00 mg/dL   Calcium 6.8 (L) 8.9 - 10.3 mg/dL   Total Protein 5.5 (L) 6.5 - 8.1 g/dL   Albumin 3.4 (L) 3.5 - 5.0 g/dL   AST 63 (H) 15 - 41 U/L   ALT 32 14 - 54 U/L   Alkaline Phosphatase 72 38 - 126 U/L   Total Bilirubin 1.1 0.3 - 1.2 mg/dL   GFR calc non Af Amer 11 (L) >60 mL/min   GFR calc Af Amer 13 (L) >60 mL/min    Comment: (NOTE) The eGFR has been calculated using the CKD EPI equation. This calculation has not been validated in all clinical situations. eGFR's persistently <60 mL/min signify possible Chronic Kidney Disease.    Anion gap 13 5 - 15  CBC WITH DIFFERENTIAL     Status: Abnormal   Collection Time: 06/28/15 10:45 AM  Result Value Ref Range   WBC 14.0 (H) 4.0 - 10.5 K/uL   RBC 3.40 (L) 3.87 - 5.11 MIL/uL   Hemoglobin 9.8 (L) 12.0 - 15.0 g/dL   HCT 28.3 (L) 36.0 - 46.0 %   MCV 83.2 78.0 - 100.0 fL   MCH 28.8 26.0 - 34.0 pg   MCHC 34.6 30.0 - 36.0 g/dL   RDW 12.9 11.5 - 15.5 %   Platelets 51 (L) 150 - 400 K/uL    Comment: SPECIMEN CHECKED FOR CLOTS LARGE PLATELETS PRESENT PLATELET COUNT CONFIRMED BY SMEAR    Neutrophils Relative % 93 %   Lymphocytes Relative 3 %   Monocytes Relative 3 %   Eosinophils Relative 1 %   Basophils Relative 0 %   Neutro Abs 13.1 (H) 1.7 - 7.7 K/uL   Lymphs Abs 0.4 (L) 0.7 - 4.0 K/uL   Monocytes Absolute 0.4 0.1 - 1.0 K/uL   Eosinophils Absolute 0.1 0.0 - 0.7 K/uL   Basophils Absolute 0.0 0.0 - 0.1 K/uL   WBC Morphology MILD LEFT SHIFT (1-5% METAS, OCC MYELO, OCC BANDS)     Comment: VACUOLATED NEUTROPHILS  I-Stat beta hCG blood, ED     Status: None   Collection Time: 06/28/15 10:58 AM  Result Value Ref Range   I-stat  hCG, quantitative <5.0 <5 mIU/mL   Comment 3            Comment:   GEST. AGE      CONC.  (mIU/mL)   <=1 WEEK        5 - 50     2 WEEKS       50 - 500  3 WEEKS       100 - 10,000     4 WEEKS     1,000 - 30,000        FEMALE AND NON-PREGNANT FEMALE:     LESS THAN 5 mIU/mL   I-Stat CG4 Lactic Acid, ED  (not at  [facility])     Status: Abnormal   Collection Time: 06/28/15 10:59 AM  Result Value Ref Range   Lactic Acid, Venous 3.41 (HH) 0.5 - 2.0 mmol/L   Comment NOTIFIED PHYSICIAN   I-Stat CG4 Lactic Acid, ED  (not at  [place])     Status: Abnormal   Collection Time: 06/28/15  1:29 PM  Result Value Ref Range   Lactic Acid, Venous 2.02 (HH) 0.5 - 2.0 mmol/L   Comment NOTIFIED PHYSICIAN     Imaging: Ct Abdomen Pelvis W Contrast  06/26/2015  CLINICAL DATA:  Right-sided flank pain.  Vomiting. EXAM: CT ABDOMEN AND PELVIS WITH CONTRAST TECHNIQUE: Multidetector CT imaging of the abdomen and pelvis was performed using the standard protocol following bolus administration of intravenous contrast. CONTRAST:  115m ISOVUE-300 IOPAMIDOL (ISOVUE-300) INJECTION 61% COMPARISON:  Abdominal ultrasound 10/20/2004 FINDINGS: Lower chest:  No acute findings. Hepatobiliary: No masses or other significant abnormality. Pancreas: No mass, inflammatory changes, or other significant abnormality. Spleen: Within normal limits in size and appearance. Adrenals/Urinary Tract: There is an obstructing 6 mm distal right ureteral stone slightly proximal to the vesicoureteral junction. There is a marked upstream right ureteral dilation with moderate right hydronephrosis. Right perirenal inflammatory fat stranding and small amount of free fluid are seen in the perirenal space. Other nonobstructing right renal calculi are seen in the lower pole of the right kidney measuring up to 4 mm. There is a tiny 1-2 mm nonobstructing calculus within the superior pole of the left kidney. The left kidney is otherwise normal. The urinary bladder is poorly  visualized as it is decompressed. Stomach/Bowel: No evidence of obstruction, inflammatory process, or abnormal fluid collections. Vascular/Lymphatic: No pathologically enlarged lymph nodes. No evidence of abdominal aortic aneurysm. Reproductive: No mass or other significant abnormality. Other: None. Musculoskeletal:  No suspicious bone lesions identified. IMPRESSION: Obstructing 6 mm distal right ureteral stone with evidence of moderate to severe right obstructive uropathy. Additional bilateral nonobstructing renal calculi. Electronically Signed   By: DFidela SalisburyM.D.   On: 06/26/2015 16:28   Dg Chest Port 1 View  06/28/2015  CLINICAL DATA:  Hypotension.  Weakness and fatigue. EXAM: PORTABLE CHEST 1 VIEW COMPARISON:  06/26/2015 CT scan FINDINGS: Mild enlargement of the cardiopericardial silhouette with indistinct upper zone pulmonary vasculature. Lungs appear otherwise clear. No pleural effusion.  No significant bony abnormality observed. IMPRESSION: 1. Mild enlargement of the cardiopericardial silhouette with suspicion for pulmonary venous hypertension. No overt edema. Electronically Signed   By: WVan ClinesM.D.   On: 06/28/2015 12:56    Assessment/Plan 1. Septic shock secondary to right ureteral obstruction from nephrolithiasis -We'll plan to emergently place a percutaneous nephrostomy tube on the right side. Hopefully this will allow some relief of infection and allow her sepsis to began to improve. -She has fairly significant derangement of some of her labs. She has thrombocytopenia with a platelet count of 51,000. She is also noted to have a significant elevation in her creatinine 4.53, which was just normal 2 days ago.  A PT/INR is pending. -I have discussed placement of a right percutaneous nephrostomy drain with the patient as well as her husband at the bedside. I have had  an extensive conversation with them about the fact that she may only receive a local anesthetic and that we may  not be able to fully sedate her given her hemodynamic state currently. She is being started on a second pressor for her blood pressure; however, her blood pressure does not begin to rise we will not be able to sedate her with her normal medications. I have also discussed with her that sometimes when placing these drains we need to use contrast. Given her elevation in creatinine the contrast may cause further damage to her kidneys. I have informed her that we would try to avoid that if possible, but if this were needed in order to safely do her procedure then that was a risk that she would need to be aware of.  She is clearly scared, but her and her husband understand and are agreeable to proceed. -Risks and Benefits discussed with the patient including, but not limited to infection, bleeding, significant bleeding causing loss or decrease in renal function or damage to adjacent structures.  All of the patient's questions were answered, patient is agreeable to proceed. Consent signed and in chart.   Thank you for this interesting consult.  I greatly enjoyed meeting ARMIDA VICKROY and look forward to participating in their care.  A copy of this report was sent to the requesting provider on this date.  Electronically Signed: Henreitta Cea 06/28/2015, 2:20 PM   I spent a total of 40 Minutes    in face to face in clinical consultation, greater than 50% of which was counseling/coordinating care for right ureteral obstruction causing septic shock, needs PCN drain

## 2015-06-28 NOTE — Progress Notes (Signed)
CRITICAL VALUE ALERT  Critical value received:  LA 2.6  Date of notification:  06/28/15  Time of notification:  1751  Critical value read back:Yes.    Nurse who received alert:  Rutha BouchardShelby Koi Zangara, RN  MD notified (1st page):  E-Link MD  Time of first page:  1752  MD notified (2nd page):  Time of second page:  Responding MD:  E-Link MD  Time MD responded:  [phone]

## 2015-06-29 DIAGNOSIS — N10 Acute tubulo-interstitial nephritis: Secondary | ICD-10-CM

## 2015-06-29 DIAGNOSIS — N200 Calculus of kidney: Secondary | ICD-10-CM

## 2015-06-29 LAB — PHOSPHORUS: Phosphorus: 3.7 mg/dL (ref 2.5–4.6)

## 2015-06-29 LAB — CBC
HCT: 27.2 % — ABNORMAL LOW (ref 36.0–46.0)
Hemoglobin: 9.5 g/dL — ABNORMAL LOW (ref 12.0–15.0)
MCH: 29.5 pg (ref 26.0–34.0)
MCHC: 34.9 g/dL (ref 30.0–36.0)
MCV: 84.5 fL (ref 78.0–100.0)
PLATELETS: 47 10*3/uL — AB (ref 150–400)
RBC: 3.22 MIL/uL — ABNORMAL LOW (ref 3.87–5.11)
RDW: 13.3 % (ref 11.5–15.5)
WBC: 14.7 10*3/uL — AB (ref 4.0–10.5)

## 2015-06-29 LAB — BLOOD CULTURE ID PANEL (REFLEXED)
Acinetobacter baumannii: NOT DETECTED
CANDIDA ALBICANS: NOT DETECTED
CANDIDA PARAPSILOSIS: NOT DETECTED
CANDIDA TROPICALIS: NOT DETECTED
CARBAPENEM RESISTANCE: NOT DETECTED
Candida glabrata: NOT DETECTED
Candida krusei: NOT DETECTED
ENTEROBACTER CLOACAE COMPLEX: NOT DETECTED
ENTEROCOCCUS SPECIES: NOT DETECTED
Enterobacteriaceae species: DETECTED — AB
Escherichia coli: DETECTED — AB
Haemophilus influenzae: NOT DETECTED
KLEBSIELLA PNEUMONIAE: NOT DETECTED
Klebsiella oxytoca: NOT DETECTED
Listeria monocytogenes: NOT DETECTED
Methicillin resistance: NOT DETECTED
Neisseria meningitidis: NOT DETECTED
PROTEUS SPECIES: NOT DETECTED
Pseudomonas aeruginosa: NOT DETECTED
STAPHYLOCOCCUS AUREUS BCID: NOT DETECTED
STREPTOCOCCUS PNEUMONIAE: NOT DETECTED
Serratia marcescens: NOT DETECTED
Staphylococcus species: NOT DETECTED
Streptococcus agalactiae: NOT DETECTED
Streptococcus pyogenes: NOT DETECTED
Streptococcus species: NOT DETECTED
VANCOMYCIN RESISTANCE: NOT DETECTED

## 2015-06-29 LAB — BASIC METABOLIC PANEL
ANION GAP: 7 (ref 5–15)
BUN: 43 mg/dL — ABNORMAL HIGH (ref 6–20)
CO2: 16 mmol/L — ABNORMAL LOW (ref 22–32)
CREATININE: 2.44 mg/dL — AB (ref 0.44–1.00)
Calcium: 6.9 mg/dL — ABNORMAL LOW (ref 8.9–10.3)
Chloride: 114 mmol/L — ABNORMAL HIGH (ref 101–111)
GFR calc non Af Amer: 24 mL/min — ABNORMAL LOW (ref >60)
GFR, EST AFRICAN AMERICAN: 28 mL/min — AB (ref >60)
GLUCOSE: 77 mg/dL (ref 65–99)
Potassium: 3.7 mmol/L (ref 3.5–5.1)
Sodium: 137 mmol/L (ref 135–145)

## 2015-06-29 LAB — MAGNESIUM: Magnesium: 1.5 mg/dL — ABNORMAL LOW (ref 1.7–2.4)

## 2015-06-29 MED ORDER — ACETAMINOPHEN 325 MG PO TABS
650.0000 mg | ORAL_TABLET | ORAL | Status: DC | PRN
  Administered 2015-07-02: 650 mg via ORAL
  Filled 2015-06-29: qty 2

## 2015-06-29 MED ORDER — SODIUM CHLORIDE 0.9 % IV SOLN: Freq: Once | INTRAVENOUS | Status: AC

## 2015-06-29 NOTE — Progress Notes (Signed)
Assessment:  Urosepsis 2ndary 6mm obstructing distal right ureteral stone. She has gram - rods in her blood cultures, with urine c/s still in progress,  D-dimer abnormal 19.57 yesterday, with wbc 14,700, and lactic acid rising from 2.0 to 2.6. Rx fluid resuscitation and support. Right perc nephrostomy placed  In ED.  She continues on rocephin, and pressors, and await c/s results. Awake and alert this AM.   Plan:  Follow. She will eventually need cysto and re-evaluation and probable surgical extraction of her Right lower ureteral stone.     Subjective:    Courtney [name] is a 40 y.o. female with no significant PMH who was evaled in the ED 2 days ago for right flank pain. She was found to have a 6mm obstructing distal right ureteral stone with mild-moderate hydroureteronephrosis. Her pain was well controlled and she was AF with stable vitals. She was discharged on zofran, percocet, and flomax with instructions to f/u with AUS. Pt presented to our office earlier today with complaints of N/V since Sunday night, whole body aches, and fatigue. She was found to be lethargic, hypotensive, and tachycardic. UA in office appears infected. Pt was brought to ED via EMS.   WBC is 14 (2.7 on 06/26/15). Cr is 4.5 (0.88 on 6/18/). Plt 51 (155 on 6/18)  Objective: Vital signs in last 24 hours: Temp:  [96.5 F (35.8 C)-99.2 F (37.3 C)] 99.2 F (37.3 C) (06/21 0400) Pulse Rate:  [83-127] 101 (06/21 0730) Resp:  [14-35] 21 (06/21 0730) BP: (68-115)/(38-68) 97/60 mmHg (06/21 0740) SpO2:  [91 %-100 %] 96 % (06/21 0730) Weight:  [57.607 kg (127 lb)-68.9 kg (151 lb 14.4 oz)] 68.9 kg (151 lb 14.4 oz) (06/21 0500)A  Intake/Output from previous day: 06/20 0701 - 06/21 0700 In: 4523.4 [I.V.:3523.4; IV Piggyback:1000] Out: 3100 [Urine:3100] Intake/Output this shift: Total I/O In: 15.3 [I.V.:15.3] Out: -   History reviewed. No pertinent past medical history.  Physical Exam:  Lungs - Normal  respiratory effort, chest expands symmetrically.  Abdomen - Soft, non-tender & non-distended.  Lab Results:  Recent Labs  06/26/15 1420 06/28/15 1045 06/29/15 0350  WBC 2.7* 14.0* 14.7*  HGB 12.2 9.8* 9.5*  HCT 37.2 28.3* 27.2*   BMET  Recent Labs  06/28/15 1410 06/29/15 0350  NA 133* 137  K 4.6 3.7  CL 109 114*  CO2 15* 16*  GLUCOSE 89 77  BUN 49* 43*  CREATININE 3.53* 2.44*  CALCIUM 5.6* 6.9*   No results for input(s): LABURIN in the last 72 hours. Results for orders placed or performed during the hospital encounter of 06/28/15  Blood Culture (routine x 2)     Status: None (Preliminary result)   Collection Time: 06/28/15 11:00 AM  Result Value Ref Range Status   Specimen Description BLOOD LEFT ARM  Final   Special Requests BOTTLES DRAWN AEROBIC AND ANAEROBIC 5 CC  Final   Culture  Setup Time   Final    GRAM NEGATIVE RODS ANAEROBIC BOTTLE ONLY Performed at [facility]    Culture GRAM NEGATIVE RODS  Final   Report Status PENDING  Incomplete  Blood Culture (routine x 2)     Status: None (Preliminary result)   Collection Time: 06/28/15 11:18 AM  Result Value Ref Range Status   Specimen Description BLOOD LEFT HAND  Final   Special Requests BOTTLES DRAWN AEROBIC ONLY 5CC  Final   Culture  Setup Time   Final    GRAM NEGATIVE RODS AEROBIC BOTTLE ONLY Organism  ID to follow Performed at [facility]    Culture PENDING  Incomplete   Report Status PENDING  Incomplete  MRSA PCR Screening     Status: None   Collection Time: 06/28/15  4:01 PM  Result Value Ref Range Status   MRSA by PCR NEGATIVE NEGATIVE Final    Comment:        The GeneXpert MRSA Assay (FDA approved for NASAL specimens only), is one component of a comprehensive MRSA colonization surveillance program. It is not intended to diagnose MRSA infection nor to guide or monitor treatment for MRSA infections.     Studies/Results: Dg Chest Port 1 View  06/28/2015  CLINICAL DATA:   Urosepsis, central line placement EXAM: PORTABLE CHEST 1 VIEW COMPARISON:  06/28/2015 FINDINGS: Cardiomediastinal silhouette is stable. Central mild vascular congestion again noted. There is right IJ central line with tip in SVC. No pneumothorax. No segmental infiltrate. IMPRESSION: Central vascular congestion again noted. Right IJ central line in place. No pneumothorax. Electronically Signed   By: Natasha Mead M.D.   On: 06/28/2015 14:18   Dg Chest Port 1 View  06/28/2015  CLINICAL DATA:  Hypotension.  Weakness and fatigue. EXAM: PORTABLE CHEST 1 VIEW COMPARISON:  06/26/2015 CT scan FINDINGS: Mild enlargement of the cardiopericardial silhouette with indistinct upper zone pulmonary vasculature. Lungs appear otherwise clear. No pleural effusion.  No significant bony abnormality observed. IMPRESSION: 1. Mild enlargement of the cardiopericardial silhouette with suspicion for pulmonary venous hypertension. No overt edema. Electronically Signed   By: Gaylyn Rong M.D.   On: 06/28/2015 12:56   Ir Nephrostomy Placement Right  06/28/2015  INDICATION: 40 year old female with a history of urosepsis an obstructive right ureteral stone. EXAM: IR NEPHROSTOMY PLACEMENT RIGHT COMPARISON:  None. MEDICATIONS: No additional medications; The antibiotic was administered in an appropriate time frame prior to skin puncture. ANESTHESIA/SEDATION: Fentanyl 50 mcg IV; Versed 0 mg IV The patient was continuously monitored during the procedure by the interventional radiology nurse under my direct supervision. CONTRAST:  10mL ISOVUE-300 IOPAMIDOL (ISOVUE-300) INJECTION 61% - administered into the collecting system(s) FLUOROSCOPY TIME:  Fluoroscopy Time: 1 minutes 24 seconds COMPLICATIONS: None PROCEDURE: Informed written consent was obtained from the patient and the patient's husband after a thorough discussion of the procedural risks, benefits and alternatives. All questions were addressed. Maximal Sterile Barrier Technique was  utilized including caps, mask, sterile gowns, sterile gloves, sterile drape, hand hygiene and skin antiseptic. A timeout was performed prior to the initiation of the procedure. Patient positioned prone position on the fluoroscopy table. Ultrasound survey of the right flank was performed with images stored and sent to PACs. The patient was then prepped and draped in the usual sterile fashion. 1% lidocaine was used to anesthetize the skin and subcutaneous tissues for local anesthesia. A Chiba needle was then used to access a posterior inferior calyx with ultrasound guidance. With spontaneous urine returned through the needle, passage of an 018 micro wire into the collecting system was performed under fluoroscopy. A small incision was made with an 11 blade scalpel, and the needle was removed from the wire. An Accustick system was then advanced over the wire into the collecting system under fluoroscopy. The metal stiffener and inner dilator were removed, and then a sample of fluid was aspirated through the 4 French outer sheath. Bentson wire was passed into the collecting system and the sheath removed. Ten French dilation of the soft tissues was performed. Using modified Seldinger technique, a 10 French pigtail catheter drain was  placed over the Bentson wire. Wire and inner stiffener removed, and the pigtail was formed in the collecting system. Small amount of contrast confirmed position of the catheter. Sample was aspirated for culture. Patient tolerated the procedure well and remained hemodynamically stable throughout. No complications were encountered and no significant blood loss encountered IMPRESSION: Status post image guided right percutaneous nephrostomy placement with a 10 French drain. Sample sent to the lab for analysis. Signed, Yvone NeuJaime S. Loreta AveWagner, DO [facility] Electronically Signed   By: Gilmer MorJaime  Wagner D.O.   On: 06/28/2015 15:56      Rilley Stash I  Delaney Perona 06/29/2015, 8:04 AM

## 2015-06-29 NOTE — Progress Notes (Signed)
Patient ID: Courtney [name], female   DOB: 1975-07-08, 40 y.o.   MRN: [id number]    Referring Physician(s): Tannenbaum,S  Supervising Physician: Irish Lack  Patient Status:  Inpatient  Chief Complaint:  Urosepsis, obstructive right ureteral stone  Subjective: Pt drowsy,"tired" but arousable; c/o some rt flank discomfort; denies n/v; family in room   Allergies: Review of patient's allergies indicates no known allergies.  Medications: Prior to Admission medications   Medication Sig Start Date End Date Taking? Authorizing Provider  ibuprofen (ADVIL,MOTRIN) 600 MG tablet Take 1 tablet (600 mg total) by mouth every 6 (six) hours as needed. Patient taking differently: Take 600 mg by mouth every 6 (six) hours as needed for moderate pain.  06/26/15  Yes Tatyana Kirichenko, PA-C  ondansetron (ZOFRAN ODT) 8 MG disintegrating tablet Take 1 tablet (8 mg total) by mouth every 8 (eight) hours as needed for nausea or vomiting. 06/26/15  Yes Tatyana Kirichenko, PA-C  oxyCODONE-acetaminophen (PERCOCET) 5-325 MG tablet Take 1-2 tablets by mouth every 4 (four) hours as needed for severe pain. 06/26/15  Yes Tatyana Kirichenko, PA-C  tamsulosin (FLOMAX) 0.4 MG CAPS capsule Take 1 capsule (0.4 mg total) by mouth daily. 06/26/15  Yes Tatyana Kirichenko, PA-C     Vital Signs: BP 110/58 mmHg  Pulse 96  Temp(Src) 98.3 F (36.8 C) (Oral)  Resp 25  Ht 5\' 5"  (1.651 m)  Wt 151 lb 14.4 oz (68.9 kg)  BMI 25.28 kg/m2  SpO2 96%  LMP 06/28/2015  Physical Exam rt PCN intact, insertion site mildly tender; output 500 cc sl blood tinged urine; cx's- e coli/enterobacter  Imaging: Ct Abdomen Pelvis W Contrast  06/26/2015  CLINICAL DATA:  Right-sided flank pain.  Vomiting. EXAM: CT ABDOMEN AND PELVIS WITH CONTRAST TECHNIQUE: Multidetector CT imaging of the abdomen and pelvis was performed using the standard protocol following bolus administration of intravenous contrast. CONTRAST:  ISOVUE-300 IOPAMIDOL  (ISOVUE-300) INJECTION 61% COMPARISON:  Abdominal ultrasound 10/20/2004 FINDINGS: Lower chest:  No acute findings. Hepatobiliary: No masses or other significant abnormality. Pancreas: No mass, inflammatory changes, or other significant abnormality. Spleen: Within normal limits in size and appearance. Adrenals/Urinary Tract: There is an obstructing 6 mm distal right ureteral stone slightly proximal to the vesicoureteral junction. There is a marked upstream right ureteral dilation with moderate right hydronephrosis. Right perirenal inflammatory fat stranding and small amount of free fluid are seen in the perirenal space. Other nonobstructing right renal calculi are seen in the lower pole of the right kidney measuring up to 4 mm. There is a tiny 1-2 mm nonobstructing calculus within the superior pole of the left kidney. The left kidney is otherwise normal. The urinary bladder is poorly visualized as it is decompressed. Stomach/Bowel: No evidence of obstruction, inflammatory process, or abnormal fluid collections. Vascular/Lymphatic: No pathologically enlarged lymph nodes. No evidence of abdominal aortic aneurysm. Reproductive: No mass or other significant abnormality. Other: None. Musculoskeletal:  No suspicious bone lesions identified. IMPRESSION: Obstructing 6 mm distal right ureteral stone with evidence of moderate to severe right obstructive uropathy. Additional bilateral nonobstructing renal calculi. Electronically Signed   By: Ted Mcalpine M.D.   On: 06/26/2015 16:28   Dg Chest Port 1 View  06/28/2015  CLINICAL DATA:  Urosepsis, central line placement EXAM: PORTABLE CHEST 1 VIEW COMPARISON:  06/28/2015 FINDINGS: Cardiomediastinal silhouette is stable. Central mild vascular congestion again noted. There is right IJ central line with tip in SVC. No pneumothorax. No segmental infiltrate. IMPRESSION: Central vascular congestion again noted. Right IJ central  line in place. No pneumothorax. Electronically  Signed   By: Natasha MeadLiviu  Pop M.D.   On: 06/28/2015 14:18   Dg Chest Port 1 View  06/28/2015  CLINICAL DATA:  Hypotension.  Weakness and fatigue. EXAM: PORTABLE CHEST 1 VIEW COMPARISON:  06/26/2015 CT scan FINDINGS: Mild enlargement of the cardiopericardial silhouette with indistinct upper zone pulmonary vasculature. Lungs appear otherwise clear. No pleural effusion.  No significant bony abnormality observed. IMPRESSION: 1. Mild enlargement of the cardiopericardial silhouette with suspicion for pulmonary venous hypertension. No overt edema. Electronically Signed   By: Gaylyn RongWalter  Liebkemann M.D.   On: 06/28/2015 12:56   Ir Nephrostomy Placement Right  06/28/2015  INDICATION: 40 year old female with a history of urosepsis an obstructive right ureteral stone. EXAM: IR NEPHROSTOMY PLACEMENT RIGHT COMPARISON:  None. MEDICATIONS: No additional medications; The antibiotic was administered in an appropriate time frame prior to skin puncture. ANESTHESIA/SEDATION: Fentanyl 50 mcg IV; Versed 0 mg IV The patient was continuously monitored during the procedure by the interventional radiology nurse under my direct supervision. CONTRAST:  10mL ISOVUE-300 IOPAMIDOL (ISOVUE-300) INJECTION 61% - administered into the collecting system(s) FLUOROSCOPY TIME:  Fluoroscopy Time: 1 minutes 24 seconds COMPLICATIONS: None PROCEDURE: Informed written consent was obtained from the patient and the patient's husband after a thorough discussion of the procedural risks, benefits and alternatives. All questions were addressed. Maximal Sterile Barrier Technique was utilized including caps, mask, sterile gowns, sterile gloves, sterile drape, hand hygiene and skin antiseptic. A timeout was performed prior to the initiation of the procedure. Patient positioned prone position on the fluoroscopy table. Ultrasound survey of the right flank was performed with images stored and sent to PACs. The patient was then prepped and draped in the usual sterile fashion.  1% lidocaine was used to anesthetize the skin and subcutaneous tissues for local anesthesia. A Chiba needle was then used to access a posterior inferior calyx with ultrasound guidance. With spontaneous urine returned through the needle, passage of an 018 micro wire into the collecting system was performed under fluoroscopy. A small incision was made with an 11 blade scalpel, and the needle was removed from the wire. An Accustick system was then advanced over the wire into the collecting system under fluoroscopy. The metal stiffener and inner dilator were removed, and then a sample of fluid was aspirated through the 4 French outer sheath. Bentson wire was passed into the collecting system and the sheath removed. Ten French dilation of the soft tissues was performed. Using modified Seldinger technique, a 10 French pigtail catheter drain was placed over the Bentson wire. Wire and inner stiffener removed, and the pigtail was formed in the collecting system. Small amount of contrast confirmed position of the catheter. Sample was aspirated for culture. Patient tolerated the procedure well and remained hemodynamically stable throughout. No complications were encountered and no significant blood loss encountered IMPRESSION: Status post image guided right percutaneous nephrostomy placement with a 10 French drain. Sample sent to the lab for analysis. Signed, Yvone NeuJaime S. Loreta AveWagner, DO [facility] Electronically Signed   By: Gilmer MorJaime  Wagner D.O.   On: 06/28/2015 15:56    Labs:  CBC:  Recent Labs  06/26/15 1420 06/28/15 1045 06/29/15 0350  WBC 2.7* 14.0* 14.7*  HGB 12.2 9.8* 9.5*  HCT 37.2 28.3* 27.2*  PLT 155 51* 47*    COAGS:  Recent Labs  06/28/15 1410  INR 1.75*  APTT 40*    BMP:  Recent Labs  06/26/15 1420 06/28/15 1045 06/28/15  1410 06/29/15 0350  NA 136 129* 133* 137  K 3.3* 5.1 4.6 3.7  CL 107 97* 109 114*  CO2 24 19* 15* 16*    GLUCOSE 137* 83 89 77  BUN 10 53* 49* 43*  CALCIUM 8.9 6.8* 5.6* 6.9*  CREATININE 0.88 4.53* 3.53* 2.44*  GFRNONAA >60 11* 15* 24*  GFRAA >60 13* 18* 28*    LIVER FUNCTION TESTS:  Recent Labs  06/26/15 1420 06/28/15 1045  BILITOT 1.6* 1.1  AST 19 63*  ALT 12* 32  ALKPHOS 44 72  PROT 6.7 5.5*  ALBUMIN 4.3 3.4*    Assessment and Plan: S/p right PCN 6/20 for obstructive rt ureteral stone/hydro/sepsis; AF; WBC 14.7(14.0); hgb 9.5; plts 47k; creat 2.44(3.53); cx's- e coli/enterobacter; hydration/antbx; monitor labs/fluid outputs closely; additional plans as per urology/CCM   Electronically Signed: D. Jeananne Rama 06/29/2015, 3:11 PM   I spent a total of 15 minutes at the the patient's bedside AND on the patient's hospital floor or unit, greater than 50% of which was counseling/coordinating care for right perc nephrostomy

## 2015-06-29 NOTE — Progress Notes (Signed)
PULMONARY / CRITICAL CARE MEDICINE   Name: Courtney [name] MRN: [id number] DOB: 1975-11-08    ADMISSION DATE:  06/28/2015 CONSULTATION DATE:  6/20  REFERRING MD:  Jodelle Grosszakowski   CHIEF COMPLAINT:  Septic shock   BRIEF:  40 y/o female with septic shock from pyelonephritis in the setting of an obstructing R nephrolithiasis.  SUBJECTIVE:  Nephrostomy tube placed yesterday Off pressors this morning  VITAL SIGNS: BP 97/60 mmHg  Pulse 101  Temp(Src) 98.6 F (37 C) (Oral)  Resp 21  Ht 5\' 5"  (1.651 m)  Wt 151 lb 14.4 oz (68.9 kg)  BMI 25.28 kg/m2  SpO2 96%  LMP 06/28/2015  HEMODYNAMICS: CVP:  [12 mmHg-13 mmHg] 13 mmHg  VENTILATOR SETTINGS:    INTAKE / OUTPUT: I/O last 3 completed shifts: In: 4523.4 [I.V.:3523.4; IV Piggyback:1000] Out: 3100 [Urine:3100]  PHYSICAL EXAMINATION: General:  Resting in bed HENT: NCAT OP clear PULM: CTA B, normal effort CV: RRR, no mgr GI: BS+, soft, nontender MSK: normal bulk and tone Neuro: Awake, alert, conversant  LABS:  BMET  Recent Labs Lab 06/28/15 1045 06/28/15 1410 06/29/15 0350  NA 129* 133* 137  K 5.1 4.6 3.7  CL 97* 109 114*  CO2 19* 15* 16*  BUN 53* 49* 43*  CREATININE 4.53* 3.53* 2.44*  GLUCOSE 83 89 77    Electrolytes  Recent Labs Lab 06/28/15 1045 06/28/15 1410 06/29/15 0350  CALCIUM 6.8* 5.6* 6.9*  MG  --  1.2* 1.5*  PHOS  --  3.9 3.7    CBC  Recent Labs Lab 06/26/15 1420 06/28/15 1045 06/29/15 0350  WBC 2.7* 14.0* 14.7*  HGB 12.2 9.8* 9.5*  HCT 37.2 28.3* 27.2*  PLT 155 51* 47*    Coag's  Recent Labs Lab 06/28/15 1410  APTT 40*  INR 1.75*    Sepsis Markers  Recent Labs Lab 06/28/15 1059 06/28/15 1329 06/28/15 1410 06/28/15 1600  LATICACIDVEN 3.41* 2.02*  --  2.6*  PROCALCITON  --   --  81.95  --     ABG No results for input(s): PHART, PCO2ART, PO2ART in the last 168 hours.  Liver Enzymes  Recent Labs Lab 06/26/15 1420 06/28/15 1045  AST 19 63*  ALT 12* 32   ALKPHOS 44 72  BILITOT 1.6* 1.1  ALBUMIN 4.3 3.4*    Cardiac Enzymes No results for input(s): TROPONINI, PROBNP in the last 168 hours.  Glucose No results for input(s): GLUCAP in the last 168 hours.  Imaging Dg Chest Port 1 View  06/28/2015  CLINICAL DATA:  Urosepsis, central line placement EXAM: PORTABLE CHEST 1 VIEW COMPARISON:  06/28/2015 FINDINGS: Cardiomediastinal silhouette is stable. Central mild vascular congestion again noted. There is right IJ central line with tip in SVC. No pneumothorax. No segmental infiltrate. IMPRESSION: Central vascular congestion again noted. Right IJ central line in place. No pneumothorax. Electronically Signed   By: Natasha MeadLiviu  Pop M.D.   On: 06/28/2015 14:18   Dg Chest Port 1 View  06/28/2015  CLINICAL DATA:  Hypotension.  Weakness and fatigue. EXAM: PORTABLE CHEST 1 VIEW COMPARISON:  06/26/2015 CT scan FINDINGS: Mild enlargement of the cardiopericardial silhouette with indistinct upper zone pulmonary vasculature. Lungs appear otherwise clear. No pleural effusion.  No significant bony abnormality observed. IMPRESSION: 1. Mild enlargement of the cardiopericardial silhouette with suspicion for pulmonary venous hypertension. No overt edema. Electronically Signed   By: Gaylyn RongWalter  Liebkemann M.D.   On: 06/28/2015 12:56   Ir Nephrostomy Placement Right  06/28/2015  INDICATION: 40 year old female with a  history of urosepsis an obstructive right ureteral stone. EXAM: IR NEPHROSTOMY PLACEMENT RIGHT COMPARISON:  None. MEDICATIONS: No additional medications; The antibiotic was administered in an appropriate time frame prior to skin puncture. ANESTHESIA/SEDATION: Fentanyl 50 mcg IV; Versed 0 mg IV The patient was continuously monitored during the procedure by the interventional radiology nurse under my direct supervision. CONTRAST:  10mL ISOVUE-300 IOPAMIDOL (ISOVUE-300) INJECTION 61% - administered into the collecting system(s) FLUOROSCOPY TIME:  Fluoroscopy Time: 1 minutes 24  seconds COMPLICATIONS: None PROCEDURE: Informed written consent was obtained from the patient and the patient's husband after a thorough discussion of the procedural risks, benefits and alternatives. All questions were addressed. Maximal Sterile Barrier Technique was utilized including caps, mask, sterile gowns, sterile gloves, sterile drape, hand hygiene and skin antiseptic. A timeout was performed prior to the initiation of the procedure. Patient positioned prone position on the fluoroscopy table. Ultrasound survey of the right flank was performed with images stored and sent to PACs. The patient was then prepped and draped in the usual sterile fashion. 1% lidocaine was used to anesthetize the skin and subcutaneous tissues for local anesthesia. A Chiba needle was then used to access a posterior inferior calyx with ultrasound guidance. With spontaneous urine returned through the needle, passage of an 018 micro wire into the collecting system was performed under fluoroscopy. A small incision was made with an 11 blade scalpel, and the needle was removed from the wire. An Accustick system was then advanced over the wire into the collecting system under fluoroscopy. The metal stiffener and inner dilator were removed, and then a sample of fluid was aspirated through the 4 French outer sheath. Bentson wire was passed into the collecting system and the sheath removed. Ten French dilation of the soft tissues was performed. Using modified Seldinger technique, a 10 French pigtail catheter drain was placed over the Bentson wire. Wire and inner stiffener removed, and the pigtail was formed in the collecting system. Small amount of contrast confirmed position of the catheter. Sample was aspirated for culture. Patient tolerated the procedure well and remained hemodynamically stable throughout. No complications were encountered and no significant blood loss encountered IMPRESSION: Status post image guided right percutaneous  nephrostomy placement with a 10 French drain. Sample sent to the lab for analysis. Signed, Yvone Neu. Loreta Ave, DO [facility] Electronically Signed   By: Gilmer Mor D.O.   On: 06/28/2015 15:56     STUDIES:  CT abd/pelvis 6/18 (prior to admit): Obstructing 6 mm distal right ureteral stone with evidence of moderate to severe right obstructive uropathy.  CULTURES: BCX2 6/20>>> e coli UC 6/20>>>  ANTIBIOTICS: Rocephin 6/20>>>  SIGNIFICANT EVENTS: 6/20 emergent nephrostomy tube  LINES/TUBES: Right IJ CVL 6/20>>>  DISCUSSION: Septic shock in setting of Obstructive Uropathy d/t right Ureteral stone. Improved post nephrostomy placement, improving multi-organ failure as of 6/21.  ASSESSMENT / PLAN:  PULMONARY A: No acute issues P:   Trend O2 sats Supplemental Oxygen to maintain O2 > 92%  CARDIOVASCULAR A:  SIRS/septic shock/MODS > improving P:  D/c vasopressors Monitor BP closely, notify MD if MAP < 65 Decrease IVF Tele  RENAL A:   AKI > improving Non-gap metabolic acidosis Hypocalcemia Obstructing neprholithiasis on right P:   Monitor BMET and UOP Replace electrolytes as needed Urology to coordinate stone removal if not passed spontaneously  GASTROINTESTINAL A:   Nausea and vomiting > improving P:   Regular diet zofran prn   HEMATOLOGIC A:   Severe  sepsis related thrombocytopenia  P:  hgb goal > 7 Trend CBC SCD  INFECTIOUS A:   Septic Shock in setting of pyelonphritis  E Coli bacteremia P:   Continue IV ceftriaxone F/u cultures   ENDOCRINE A:   No acute  P:   Trend glucose   NEUROLOGIC A:   Myalgias P:   Prn APAP  FAMILY  - Updates: Husband Dante updated bedside 6/21  CCM time 35 minutes  Heber Westbrook, MD [place] [facility] Pager: [phone] Cell: [phone] After 3pm or if no response, call [phone]  06/29/2015, 9:09 AM

## 2015-06-29 NOTE — Progress Notes (Signed)
PHARMACY - PHYSICIAN COMMUNICATION CRITICAL VALUE ALERT - BLOOD CULTURE IDENTIFICATION (BCID)  Results for orders placed or performed during the hospital encounter of 06/28/15  Blood Culture ID Panel (Reflexed) (Collected: 06/28/2015 11:18 AM)  Result Value Ref Range   Enterococcus species NOT DETECTED NOT DETECTED   Vancomycin resistance NOT DETECTED NOT DETECTED   Listeria monocytogenes NOT DETECTED NOT DETECTED   Staphylococcus species NOT DETECTED NOT DETECTED   Staphylococcus aureus NOT DETECTED NOT DETECTED   Methicillin resistance NOT DETECTED NOT DETECTED   Streptococcus species NOT DETECTED NOT DETECTED   Streptococcus agalactiae NOT DETECTED NOT DETECTED   Streptococcus pneumoniae NOT DETECTED NOT DETECTED   Streptococcus pyogenes NOT DETECTED NOT DETECTED   Acinetobacter baumannii NOT DETECTED NOT DETECTED   Enterobacteriaceae species DETECTED (A) NOT DETECTED   Enterobacter cloacae complex NOT DETECTED NOT DETECTED   Escherichia coli DETECTED (A) NOT DETECTED   Klebsiella oxytoca NOT DETECTED NOT DETECTED   Klebsiella pneumoniae NOT DETECTED NOT DETECTED   Proteus species NOT DETECTED NOT DETECTED   Serratia marcescens NOT DETECTED NOT DETECTED   Carbapenem resistance NOT DETECTED NOT DETECTED   Haemophilus influenzae NOT DETECTED NOT DETECTED   Neisseria meningitidis NOT DETECTED NOT DETECTED   Pseudomonas aeruginosa NOT DETECTED NOT DETECTED   Candida albicans NOT DETECTED NOT DETECTED   Candida glabrata NOT DETECTED NOT DETECTED   Candida krusei NOT DETECTED NOT DETECTED   Candida parapsilosis NOT DETECTED NOT DETECTED   Candida tropicalis NOT DETECTED NOT DETECTED    Name of physician (or Provider) Contacted: Anders SimmondsPete Babcock, NP  Changes to prescribed antibiotics required: No changes necessary - continue Rocephin 2g IV q24  Berkley HarveyLegge, Kelaiah Escalona Marshall 06/29/2015  8:37 AM

## 2015-06-30 DIAGNOSIS — R7881 Bacteremia: Secondary | ICD-10-CM

## 2015-06-30 DIAGNOSIS — N390 Urinary tract infection, site not specified: Secondary | ICD-10-CM

## 2015-06-30 DIAGNOSIS — A4151 Sepsis due to Escherichia coli [E. coli]: Principal | ICD-10-CM

## 2015-06-30 LAB — URINE CULTURE: Culture: 20000 — AB

## 2015-06-30 LAB — BASIC METABOLIC PANEL
ANION GAP: 4 — AB (ref 5–15)
BUN: 29 mg/dL — ABNORMAL HIGH (ref 6–20)
CALCIUM: 7.7 mg/dL — AB (ref 8.9–10.3)
CO2: 19 mmol/L — ABNORMAL LOW (ref 22–32)
CREATININE: 1.04 mg/dL — AB (ref 0.44–1.00)
Chloride: 117 mmol/L — ABNORMAL HIGH (ref 101–111)
GLUCOSE: 91 mg/dL (ref 65–99)
Potassium: 3.4 mmol/L — ABNORMAL LOW (ref 3.5–5.1)
Sodium: 140 mmol/L (ref 135–145)

## 2015-06-30 LAB — CBC
HCT: 26.6 % — ABNORMAL LOW (ref 36.0–46.0)
HEMOGLOBIN: 9.3 g/dL — AB (ref 12.0–15.0)
MCH: 29.3 pg (ref 26.0–34.0)
MCHC: 35 g/dL (ref 30.0–36.0)
MCV: 83.9 fL (ref 78.0–100.0)
PLATELETS: 58 10*3/uL — AB (ref 150–400)
RBC: 3.17 MIL/uL — AB (ref 3.87–5.11)
RDW: 13.6 % (ref 11.5–15.5)
WBC: 17.2 10*3/uL — ABNORMAL HIGH (ref 4.0–10.5)

## 2015-06-30 MED ORDER — HEPARIN SODIUM (PORCINE) 5000 UNIT/ML IJ SOLN
5000.0000 [IU] | Freq: Three times a day (TID) | INTRAMUSCULAR | Status: DC
  Administered 2015-06-30 – 2015-07-05 (×16): 5000 [IU] via SUBCUTANEOUS
  Filled 2015-06-30 (×20): qty 1

## 2015-06-30 NOTE — Progress Notes (Signed)
eLink Physician-Brief Progress Note Patient Name: Courtney LennoxMargarita E Truxillo DOB: 09-Oct-1975 MRN: [id number]   Date of Service  06/30/2015  HPI/Events of Note  Family objecting to transfer to med-surg floor.   eICU Interventions  Will transfer patient to stepdown bed status. Rounding team to sort out situation in morning.      Intervention Category Minor Interventions: Routine modifications to care plan (e.g. PRN medications for pain, fever)  Jamarkus Lisbon Eugene 06/30/2015, 4:21 PM

## 2015-06-30 NOTE — Progress Notes (Signed)
Assessment:  Improving e. Coli urosepsis 2ndary 6mm R lower ureteral stone. Mother has hx of stones. No colas. 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 She will eventually need [facility] evaluation as outpatient.   Plan:  1. Leave perc tube in place at discharge            2.  Allow pt to eat, heal for about 3 weeks at home before definitive stone surgery            3. Return to office after discharge  [phone]) for Urologic f/u and surgical scheduling.             4. Tamsulosin,. 0.4mg  po/day, if pt normotensive, and tolerates alpha-blocker without dizziness. Will need urinary strainer at discharge. If she passes stone spontaneously, then we wil cancel stone surgery.     Subjective:   6mm Right lower ureteral stone. Urosepsis. E. Coli,Slowly improving   Objective: Vital signs in last 24 hours: Temp:  [98.3 F (36.8 C)-99.3 F (37.4 C)] 98.6 F (37 C) (06/22 0839) Pulse Rate:  [78-112] 78 (06/22 0740) Resp:  [20-35] 28 (06/22 0740) BP: (97-116)/(57-69) 103/64 mmHg (06/22 0740) SpO2:  [91 %-100 %] 100 % (06/22 0740) Weight:  [67.7 kg (149 lb 4 oz)] 67.7 kg (149 lb 4 oz) (06/22 0400)A  Intake/Output from previous day: 06/21 0701 - 06/22 0700 In: 186.5 [P.O.:120; I.V.:16.5; IV Piggyback:50] Out: 2325 [Urine:2325] Intake/Output this shift: Total I/O In: 120 [P.O.:120] Out: -   History reviewed. No pertinent past medical history.  Physical Exam:  Lungs - Normal respiratory effort, chest expands symmetrically.  Abdomen - Soft, non-tender & non-distended.  Lab Results:  Recent Labs  06/28/15 1045 06/29/15 0350 06/30/15 0400  WBC 14.0* 14.7* 17.2*  HGB 9.8* 9.5* 9.3*  HCT 28.3* 27.2* 26.6*   BMET  Recent Labs  06/29/15 0350 06/30/15 0400  NA 137 140  K 3.7 3.4*  CL 114* 117*  CO2 16* 19*  GLUCOSE 77 91  BUN 43* 29*  CREATININE 2.44* 1.04*  CALCIUM 6.9* 7.7*   No  results for input(s): LABURIN in the last 72 hours. Results for orders placed or performed during the hospital encounter of 06/28/15  Blood Culture (routine x 2)     Status: None (Preliminary result)   Collection Time: 06/28/15 11:00 AM  Result Value Ref Range Status   Specimen Description BLOOD LEFT ARM  Final   Special Requests BOTTLES DRAWN AEROBIC AND ANAEROBIC 5 CC  Final   Culture  Setup Time   Final    GRAM NEGATIVE RODS IN BOTH AEROBIC AND ANAEROBIC BOTTLES CRITICAL RESULT CALLED TO, READ BACK BY AND VERIFIED WITH: C.SUMME [id number]  M.MOHR    Culture   Final    GRAM NEGATIVE RODS IDENTIFICATION TO FOLLOW Performed at [facility]    Report Status PENDING  Incomplete  Blood Culture (routine x 2)     Status: Abnormal (Preliminary result)   Collection Time: 06/28/15 11:18 AM  Result Value Ref Range Status   Specimen Description BLOOD LEFT HAND  Final   Special Requests BOTTLES DRAWN AEROBIC ONLY 5CC  Final   Culture  Setup Time   Final    GRAM NEGATIVE RODS AEROBIC BOTTLE ONLY Organism ID to follow CRITICAL RESULT CALLED TO, READ BACK BY AND VERIFIED WITH: C.SUMME [id number] @ 0806  M.MOHR    Culture (A)  Final    ESCHERICHIA  COLI SUSCEPTIBILITIES TO FOLLOW Performed at [facility]    Report Status PENDING  Incomplete  Blood Culture ID Panel (Reflexed)     Status: Abnormal   Collection Time: 06/28/15 11:18 AM  Result Value Ref Range Status   Enterococcus species NOT DETECTED NOT DETECTED Final   Vancomycin resistance NOT DETECTED NOT DETECTED Final   Listeria monocytogenes NOT DETECTED NOT DETECTED Final   Staphylococcus species NOT DETECTED NOT DETECTED Final   Staphylococcus aureus NOT DETECTED NOT DETECTED Final   Methicillin resistance NOT DETECTED NOT DETECTED Final   Streptococcus species NOT DETECTED NOT DETECTED Final   Streptococcus agalactiae NOT DETECTED NOT DETECTED Final   Streptococcus pneumoniae NOT DETECTED NOT DETECTED Final    Streptococcus pyogenes NOT DETECTED NOT DETECTED Final   Acinetobacter baumannii NOT DETECTED NOT DETECTED Final   Enterobacteriaceae species DETECTED (A) NOT DETECTED Corrected    Comment: CRITICAL RESULT CALLED TO, READ BACK BY AND VERIFIED WITH: C.SUMME [id number] @0806  M.MOHR CORRECTED ON 06/21 AT 0812: PREVIOUSLY REPORTED AS DETECTED C.SUMME [id number] @0806  M.MOHR    Enterobacter cloacae complex NOT DETECTED NOT DETECTED Corrected    Comment: CORRECTED ON 06/21 AT [id number]: PREVIOUSLY REPORTED AS DETECTED CRITICAL RESULT CALLED TO, READ BACK BY AND VERIFIED WITH: C.SUMME [id number] @0806  M.MOHR   Escherichia coli DETECTED (A) NOT DETECTED Corrected    Comment: CRITICAL RESULT CALLED TO, READ BACK BY AND VERIFIED WITH: C.SUMME [phone] M.MOHR CORRECTED ON 06/21 AT [id number]: PREVIOUSLY REPORTED AS NOT DETECTED    Klebsiella oxytoca NOT DETECTED NOT DETECTED Final   Klebsiella pneumoniae NOT DETECTED NOT DETECTED Final   Proteus species NOT DETECTED NOT DETECTED Final   Serratia marcescens NOT DETECTED NOT DETECTED Final   Carbapenem resistance NOT DETECTED NOT DETECTED Final   Haemophilus influenzae NOT DETECTED NOT DETECTED Final   Neisseria meningitidis NOT DETECTED NOT DETECTED Final   Pseudomonas aeruginosa NOT DETECTED NOT DETECTED Final   Candida albicans NOT DETECTED NOT DETECTED Final   Candida glabrata NOT DETECTED NOT DETECTED Final   Candida krusei NOT DETECTED NOT DETECTED Final   Candida parapsilosis NOT DETECTED NOT DETECTED Final   Candida tropicalis NOT DETECTED NOT DETECTED Final    Comment: Performed at [facility]  Urine culture     Status: Abnormal   Collection Time: 06/28/15  3:41 PM  Result Value Ref Range Status   Specimen Description URINE, CATHETERIZED  Final   Special Requests NONE  Final   Culture 20,000 COLONIES/mL ESCHERICHIA COLI (A)  Final   Report Status 06/30/2015 FINAL  Final   Organism ID, Bacteria ESCHERICHIA COLI (A)  Final      Susceptibility    Escherichia coli - MIC*    AMPICILLIN <=2 SENSITIVE Sensitive     CEFAZOLIN <=4 SENSITIVE Sensitive     CEFTRIAXONE <=1 SENSITIVE Sensitive     CIPROFLOXACIN <=0.25 SENSITIVE Sensitive     GENTAMICIN <=1 SENSITIVE Sensitive     IMIPENEM <=0.25 SENSITIVE Sensitive     NITROFURANTOIN <=16 SENSITIVE Sensitive     TRIMETH/SULFA <=20 SENSITIVE Sensitive     AMPICILLIN/SULBACTAM <=2 SENSITIVE Sensitive     PIP/TAZO <=4 SENSITIVE Sensitive     * 20,000 COLONIES/mL ESCHERICHIA COLI  MRSA PCR Screening     Status: None   Collection Time: 06/28/15  4:01 PM  Result Value Ref Range Status   MRSA by PCR NEGATIVE NEGATIVE Final    Comment:        The GeneXpert MRSA Assay (FDA approved  for NASAL specimens only), is one component of a comprehensive MRSA colonization surveillance program. It is not intended to diagnose MRSA infection nor to guide or monitor treatment for MRSA infections.     Studies/Results: No results found.    Bobetta Korf I Adama Ivins 06/30/2015, 8:51 AM

## 2015-06-30 NOTE — Progress Notes (Signed)
PULMONARY / CRITICAL CARE MEDICINE   Name: Courtney [name] MRN: [id number] DOB: [birth year]    ADMISSION DATE:  06/28/2015 CONSULTATION DATE:  6/20  REFERRING MD:  Jodelle Grosszakowski   CHIEF COMPLAINT:  Septic shock   BRIEF:  40 y/o female with septic shock from pyelonephritis in the setting of an obstructing R nephrolithiasis.  SUBJECTIVE:  Quiet night Slept off and on Eating Hasn't been out of bed yet  VITAL SIGNS: BP 103/64 mmHg  Pulse 78  Temp(Src) 98.6 F (37 C) (Oral)  Resp 28  Ht 5\' 5"  (1.651 m)  Wt 149 lb 4 oz (67.7 kg)  BMI 24.84 kg/m2  SpO2 100%  LMP 06/28/2015  HEMODYNAMICS:    VENTILATOR SETTINGS:    INTAKE / OUTPUT: I/O last 3 completed shifts: In: 3300.6 [P.O.:120; I.V.:2130.6; IV Piggyback:1050] Out: 4875 [Urine:4875]  PHYSICAL EXAMINATION: General:  Resting in bed HENT: NCAT OP clear PULM: CTA B, slightly increased effort CV: RRR, no mgr GI: BS+, soft, nontender MSK: normal bulk and tone Neuro: Awake, alert, conversant  LABS:  BMET  Recent Labs Lab 06/28/15 1410 06/29/15 0350 06/30/15 0400  NA 133* 137 140  K 4.6 3.7 3.4*  CL 109 114* 117*  CO2 15* 16* 19*  BUN 49* 43* 29*  CREATININE 3.53* 2.44* 1.04*  GLUCOSE 89 77 91    Electrolytes  Recent Labs Lab 06/28/15 1410 06/29/15 0350 06/30/15 0400  CALCIUM 5.6* 6.9* 7.7*  MG 1.2* 1.5*  --   PHOS 3.9 3.7  --     CBC  Recent Labs Lab 06/28/15 1045 06/29/15 0350 06/30/15 0400  WBC 14.0* 14.7* 17.2*  HGB 9.8* 9.5* 9.3*  HCT 28.3* 27.2* 26.6*  PLT 51* 47* 58*    Coag's  Recent Labs Lab 06/28/15 1410  APTT 40*  INR 1.75*    Sepsis Markers  Recent Labs Lab 06/28/15 1059 06/28/15 1329 06/28/15 1410 06/28/15 1600  LATICACIDVEN 3.41* 2.02*  --  2.6*  PROCALCITON  --   --  81.95  --     ABG No results for input(s): PHART, PCO2ART, PO2ART in the last 168 hours.  Liver Enzymes  Recent Labs Lab 06/26/15 1420 06/28/15 1045  AST 19 63*  ALT 12* 32   ALKPHOS 44 72  BILITOT 1.6* 1.1  ALBUMIN 4.3 3.4*    Cardiac Enzymes No results for input(s): TROPONINI, PROBNP in the last 168 hours.  Glucose No results for input(s): GLUCAP in the last 168 hours.  Imaging No results found.   STUDIES:  CT abd/pelvis 6/18 (prior to admit): Obstructing 6 mm distal right ureteral stone with evidence of moderate to severe right obstructive uropathy.  CULTURES: BCX2 6/20>>> e coli UC 6/20>>> e coli  ANTIBIOTICS: Rocephin 6/20>>>  SIGNIFICANT EVENTS: 6/20 emergent nephrostomy tube  LINES/TUBES: Right IJ CVL 6/20>>>  DISCUSSION: Septic shock in setting of Obstructive Uropathy d/t right Ureteral stone. Improved post nephrostomy placement, improving multi-organ failure as of 6/21.  ASSESSMENT / PLAN:  PULMONARY A: No acute issues P:   Trend O2 sats Supplemental Oxygen to maintain O2 > 92%  CARDIOVASCULAR A:  SIRS/septic shock/MODS > improved significantly P:  Monitor BP closely Start Flomax if BP persistently > 110 Tele  RENAL A:   AKI > resolved Non-gap metabolic acidosis > improving Hypocalcemia > improving Obstructing neprholithiasis on right, s/p nephrostomy P:   Monitor BMET and UOP Replace electrolytes as needed Urology to coordinate outpatient stone removal  Add flomax when BP allows  GASTROINTESTINAL A:  Nausea and vomiting > improving P:   Regular diet zofran prn   HEMATOLOGIC A:   Severe sepsis related thrombocytopenia > improving P:  hgb goal > 7 Trend CBC SCD Start sub cutaneous heparin given immobility, DVT risk Encouraged mobility today  INFECTIOUS A:   Septic Shock in setting of pyelonphritis > improved significantly E Coli bacteremia and UTI P:   Continue IV ceftriaxone F/u cultures for sensitivities  ENDOCRINE A:   No acute  P:   Trend glucose   NEUROLOGIC A:   Myalgias P:   Prn APAP Out of bed  FAMILY  - Updates: Husband Dante updated bedside 6/22  To floor, [facility]  service  Heber CarolinaBrent McQuaid, MD [place] [facility] Pager: [phone] Cell: [phone] After 3pm or if no response, call [phone]  06/30/2015, 8:48 AM

## 2015-06-30 NOTE — Progress Notes (Signed)
Patient ID: Courtney [name], female   DOB: Jun 02, 1975, 40 y.o.   MRN: [id number]    Referring Physician(s): Tannenbaum,s  Supervising Physician: Richarda OverlieHenn, Adam  Patient Status:  Inpatient  Chief Complaint:  Urosepsis, obstructive right ureteral stone  Subjective: Patient without acute changes  Allergies: Review of patient's allergies indicates no known allergies.  Medications: Prior to Admission medications   Medication Sig Start Date End Date Taking? Authorizing Provider  ibuprofen (ADVIL,MOTRIN) 600 MG tablet Take 1 tablet (600 mg total) by mouth every 6 (six) hours as needed. Patient taking differently: Take 600 mg by mouth every 6 (six) hours as needed for moderate pain.  06/26/15  Yes Tatyana Kirichenko, PA-C  ondansetron (ZOFRAN ODT) 8 MG disintegrating tablet Take 1 tablet (8 mg total) by mouth every 8 (eight) hours as needed for nausea or vomiting. 06/26/15  Yes Tatyana Kirichenko, PA-C  oxyCODONE-acetaminophen (PERCOCET) 5-325 MG tablet Take 1-2 tablets by mouth every 4 (four) hours as needed for severe pain. 06/26/15  Yes Tatyana Kirichenko, PA-C  tamsulosin (FLOMAX) 0.4 MG CAPS capsule Take 1 capsule (0.4 mg total) by mouth daily. 06/26/15  Yes Tatyana Kirichenko, PA-C     Vital Signs: BP 103/64 mmHg  Pulse 78  Temp(Src) 98.6 F (37 C) (Oral)  Resp 28  Ht 5\' 5"  (1.651 m)  Wt 149 lb 4 oz (67.7 kg)  BMI 24.84 kg/m2  SpO2 100%  LMP 06/28/2015  Physical Exam awake, alert. Right PCN intact, output approximately 1 L yellow urine. Insertion site mildly tender. Cultures-E coli  Imaging: Ct Abdomen Pelvis W Contrast  06/26/2015  CLINICAL DATA:  Right-sided flank pain.  Vomiting. EXAM: CT ABDOMEN AND PELVIS WITH CONTRAST TECHNIQUE: Multidetector CT imaging of the abdomen and pelvis was performed using the standard protocol following bolus administration of intravenous contrast. CONTRAST:  100mL ISOVUE-300 IOPAMIDOL (ISOVUE-300) INJECTION 61% COMPARISON:  Abdominal ultrasound  10/20/2004 FINDINGS: Lower chest:  No acute findings. Hepatobiliary: No masses or other significant abnormality. Pancreas: No mass, inflammatory changes, or other significant abnormality. Spleen: Within normal limits in size and appearance. Adrenals/Urinary Tract: There is an obstructing 6 mm distal right ureteral stone slightly proximal to the vesicoureteral junction. There is a marked upstream right ureteral dilation with moderate right hydronephrosis. Right perirenal inflammatory fat stranding and small amount of free fluid are seen in the perirenal space. Other nonobstructing right renal calculi are seen in the lower pole of the right kidney measuring up to 4 mm. There is a tiny 1-2 mm nonobstructing calculus within the superior pole of the left kidney. The left kidney is otherwise normal. The urinary bladder is poorly visualized as it is decompressed. Stomach/Bowel: No evidence of obstruction, inflammatory process, or abnormal fluid collections. Vascular/Lymphatic: No pathologically enlarged lymph nodes. No evidence of abdominal aortic aneurysm. Reproductive: No mass or other significant abnormality. Other: None. Musculoskeletal:  No suspicious bone lesions identified. IMPRESSION: Obstructing 6 mm distal right ureteral stone with evidence of moderate to severe right obstructive uropathy. Additional bilateral nonobstructing renal calculi. Electronically Signed   By: Ted Mcalpineobrinka  Dimitrova M.D.   On: 06/26/2015 16:28   Dg Chest Port 1 View  06/28/2015  CLINICAL DATA:  Urosepsis, central line placement EXAM: PORTABLE CHEST 1 VIEW COMPARISON:  06/28/2015 FINDINGS: Cardiomediastinal silhouette is stable. Central mild vascular congestion again noted. There is right IJ central line with tip in SVC. No pneumothorax. No segmental infiltrate. IMPRESSION: Central vascular congestion again noted. Right IJ central line in place. No pneumothorax. Electronically Signed   By: Lang SnowLiviu  Pop M.D.   On: 06/28/2015 14:18   Dg Chest  Port 1 View  06/28/2015  CLINICAL DATA:  Hypotension.  Weakness and fatigue. EXAM: PORTABLE CHEST 1 VIEW COMPARISON:  06/26/2015 CT scan FINDINGS: Mild enlargement of the cardiopericardial silhouette with indistinct upper zone pulmonary vasculature. Lungs appear otherwise clear. No pleural effusion.  No significant bony abnormality observed. IMPRESSION: 1. Mild enlargement of the cardiopericardial silhouette with suspicion for pulmonary venous hypertension. No overt edema. Electronically Signed   By: Gaylyn Rong M.D.   On: 06/28/2015 12:56   Ir Nephrostomy Placement Right  06/28/2015  INDICATION: 41 year old female with a history of urosepsis an obstructive right ureteral stone. EXAM: IR NEPHROSTOMY PLACEMENT RIGHT COMPARISON:  None. MEDICATIONS: No additional medications; The antibiotic was administered in an appropriate time frame prior to skin puncture. ANESTHESIA/SEDATION: Fentanyl 50 mcg IV; Versed 0 mg IV The patient was continuously monitored during the procedure by the interventional radiology nurse under my direct supervision. CONTRAST:  10mL ISOVUE-300 IOPAMIDOL (ISOVUE-300) INJECTION 61% - administered into the collecting system(s) FLUOROSCOPY TIME:  Fluoroscopy Time: 1 minutes 24 seconds COMPLICATIONS: None PROCEDURE: Informed written consent was obtained from the patient and the patient's husband after a thorough discussion of the procedural risks, benefits and alternatives. All questions were addressed. Maximal Sterile Barrier Technique was utilized including caps, mask, sterile gowns, sterile gloves, sterile drape, hand hygiene and skin antiseptic. A timeout was performed prior to the initiation of the procedure. Patient positioned prone position on the fluoroscopy table. Ultrasound survey of the right flank was performed with images stored and sent to PACs. The patient was then prepped and draped in the usual sterile fashion. 1% lidocaine was used to anesthetize the skin and subcutaneous  tissues for local anesthesia. A Chiba needle was then used to access a posterior inferior calyx with ultrasound guidance. With spontaneous urine returned through the needle, passage of an 018 micro wire into the collecting system was performed under fluoroscopy. A small incision was made with an 11 blade scalpel, and the needle was removed from the wire. An Accustick system was then advanced over the wire into the collecting system under fluoroscopy. The metal stiffener and inner dilator were removed, and then a sample of fluid was aspirated through the 4 French outer sheath. Bentson wire was passed into the collecting system and the sheath removed. Ten French dilation of the soft tissues was performed. Using modified Seldinger technique, a 10 French pigtail catheter drain was placed over the Bentson wire. Wire and inner stiffener removed, and the pigtail was formed in the collecting system. Small amount of contrast confirmed position of the catheter. Sample was aspirated for culture. Patient tolerated the procedure well and remained hemodynamically stable throughout. No complications were encountered and no significant blood loss encountered IMPRESSION: Status post image guided right percutaneous nephrostomy placement with a 10 French drain. Sample sent to the lab for analysis. Signed, Yvone Neu. Loreta Ave, DO [facility] Electronically Signed   By: Gilmer Mor D.O.   On: 06/28/2015 15:56    Labs:  CBC:  Recent Labs  06/26/15 1420 06/28/15 1045 06/29/15 0350 06/30/15 0400  WBC 2.7* 14.0* 14.7* 17.2*  HGB 12.2 9.8* 9.5* 9.3*  HCT 37.2 28.3* 27.2* 26.6*  PLT 155 51* 47* 58*    COAGS:  Recent Labs  06/28/15 1410  INR 1.75*  APTT 40*    BMP:  Recent Labs  06/28/15 1045 06/28/15 1410 06/29/15 0350 06/30/15 0400  NA 129*  133* 137 140  K 5.1 4.6 3.7 3.4*  CL 97* 109 114* 117*  CO2 19* 15* 16* 19*  GLUCOSE 83 89 77 91  BUN 53*  49* 43* 29*  CALCIUM 6.8* 5.6* 6.9* 7.7*  CREATININE 4.53* 3.53* 2.44* 1.04*  GFRNONAA 11* 15* 24* >60  GFRAA 13* 18* 28* >60    LIVER FUNCTION TESTS:  Recent Labs  06/26/15 1420 06/28/15 1045  BILITOT 1.6* 1.1  AST 19 63*  ALT 12* 32  ALKPHOS 44 72  PROT 6.7 5.5*  ALBUMIN 4.3 3.4*    Assessment and Plan: S/p right PCN 6/20 for obstructive rt ureteral stone/hydro/sepsis; AF; WBC 17.2 (14.7); hemoglobin 9.3, platelets 58k; potassium 3.4, creatinine 1.04 (2.44); continue nephrostomy as per urology plans.   Electronically Signed: D. Jeananne RamaKevin Jake Goodson 06/30/2015, 12:26 PM   I spent a total of 15 minutes at the the patient's bedside AND on the patient's hospital floor or unit, greater than 50% of which was counseling/coordinating care for right percutaneous nephrostomy

## 2015-07-01 DIAGNOSIS — N3000 Acute cystitis without hematuria: Secondary | ICD-10-CM

## 2015-07-01 LAB — BASIC METABOLIC PANEL
Anion gap: 5 (ref 5–15)
BUN: 17 mg/dL (ref 6–20)
CO2: 20 mmol/L — ABNORMAL LOW (ref 22–32)
CREATININE: 0.66 mg/dL (ref 0.44–1.00)
Calcium: 7.7 mg/dL — ABNORMAL LOW (ref 8.9–10.3)
Chloride: 115 mmol/L — ABNORMAL HIGH (ref 101–111)
GFR calc Af Amer: >60 mL/min (ref >60)
GLUCOSE: 100 mg/dL — AB (ref 65–99)
Potassium: 3.4 mmol/L — ABNORMAL LOW (ref 3.5–5.1)
SODIUM: 140 mmol/L (ref 135–145)

## 2015-07-01 LAB — CULTURE, BLOOD (ROUTINE X 2)

## 2015-07-01 LAB — CBC
HCT: 27.8 % — ABNORMAL LOW (ref 36.0–46.0)
HEMOGLOBIN: 9.6 g/dL — AB (ref 12.0–15.0)
MCH: 29.2 pg (ref 26.0–34.0)
MCHC: 34.5 g/dL (ref 30.0–36.0)
MCV: 84.5 fL (ref 78.0–100.0)
PLATELETS: 67 10*3/uL — AB (ref 150–400)
RBC: 3.29 MIL/uL — ABNORMAL LOW (ref 3.87–5.11)
RDW: 13.9 % (ref 11.5–15.5)
WBC: 21.9 10*3/uL — ABNORMAL HIGH (ref 4.0–10.5)

## 2015-07-01 MED ORDER — SODIUM CHLORIDE 0.9 % IV SOLN
1.0000 g | Freq: Four times a day (QID) | INTRAVENOUS | Status: DC
  Administered 2015-07-01 – 2015-07-06 (×21): 1 g via INTRAVENOUS
  Filled 2015-07-01 (×22): qty 1000

## 2015-07-01 MED ORDER — ALBUTEROL SULFATE (2.5 MG/3ML) 0.083% IN NEBU
2.5000 mg | INHALATION_SOLUTION | Freq: Once | RESPIRATORY_TRACT | Status: AC
  Administered 2015-07-01: 2.5 mg via RESPIRATORY_TRACT
  Filled 2015-07-01: qty 3

## 2015-07-01 NOTE — Progress Notes (Signed)
PROGRESS NOTE    Courtney LennoxMargarita E [name]  ZOX:[id number] DOB: [birth year] DOA: 06/28/2015 PCP: Nicki ReaperBAITY, REGINA, NP   Brief Narrative:  Patient is a 40 year old who presented with Escherichia coli urosepsis secondary to 6 mm right lower ureteral stone. Patient initially was seen and had septic shock and required pressors. Critical care initially admitted patient and subsequently patient after improvement has been transitioned to our team.   Assessment & Plan:   Active Problems:   Septic shock (HCC) - Resolving - Per discussion with pharmacy will transition patient to ampicillin discontinue Rocephin - Urine culture reporting Escherichia coli - Blood culture reporting Escherichia coli. Patient will require 14 days of antibiotic therapy  Escherichia coli bacteremia - For now we'll continue IV ampicillin with plans to transition to oral antibiotic regimen with continued improvement in condition and resolution of fevers and leukocytosis. - Patient will need 14 days total of antibiotics. Today being day 4 of 14  Ureteral stone - Urology managing. IR consulted and per tube placed. - Plan is to allow patient to heal for about 3 weeks at home before definitive stone surgery   DVT prophylaxis: Heparin Code Status: Full Family Communication: None at bedside Disposition Plan: Pending improvement in condition   Consultants:   Radiology/  urology   Procedures: Percutaneous tube   Antimicrobials: Ampicillin   Subjective: Patient has no new complaints today  Objective: Filed Vitals:   07/01/15 0900 07/01/15 1000 07/01/15 1100 07/01/15 1200  BP:      Pulse: 98 92 87 98  Temp:      TempSrc:      Resp: 25 40 33 24  Height:      Weight:      SpO2: 97% 97% 97% 97%    Intake/Output Summary (Last 24 hours) at 07/01/15 1235 Last data filed at 07/01/15 1218  Gross per 24 hour  Intake    410 ml  Output    825 ml  Net   -415 ml   Filed Weights   06/28/15 1545 06/29/15 0500 06/30/15  0400  Weight: 66.7 kg (147 lb 0.8 oz) 68.9 kg (151 lb 14.4 oz) 67.7 kg (149 lb 4 oz)    Examination:  General exam: Appears calm and comfortable  Respiratory system: Clear to auscultation. Respiratory effort normal. Cardiovascular system: S1 & S2 heard, RRR. Gastrointestinal system: Abdomen is nondistended, soft and nontender. Central nervous system: Alert and oriented. No focal neurological deficits. Extremities: Symmetric 5 x 5 power. Skin: No rashes, lesions or ulcers on limited exam Psychiatry: Judgement and insight appear normal. Mood & affect appropriate.     Data Reviewed: I have personally reviewed following labs and imaging studies  CBC:  Recent Labs Lab 06/26/15 1420 06/28/15 1045 06/29/15 0350 06/30/15 0400 07/01/15 0309  WBC 2.7* 14.0* 14.7* 17.2* 21.9*  NEUTROABS 2.4 13.1*  --   --   --   HGB 12.2 9.8* 9.5* 9.3* 9.6*  HCT 37.2 28.3* 27.2* 26.6* 27.8*  MCV 87.9 83.2 84.5 83.9 84.5  PLT 155 51* 47* 58* 67*   Basic Metabolic Panel:  Recent Labs Lab 06/28/15 1045 06/28/15 1410 06/29/15 0350 06/30/15 0400 07/01/15 0309  NA 129* 133* 137 140 140  K 5.1 4.6 3.7 3.4* 3.4*  CL 97* 109 114* 117* 115*  CO2 19* 15* 16* 19* 20*  GLUCOSE 83 89 77 91 100*  BUN 53* 49* 43* 29* 17  CREATININE 4.53* 3.53* 2.44* 1.04* 0.66  CALCIUM 6.8* 5.6* 6.9* 7.7* 7.7*  MG  --  1.2* 1.5*  --   --   PHOS  --  3.9 3.7  --   --    GFR: Estimated Creatinine Clearance: 85.8 mL/min (by C-G formula based on Cr of 0.66). Liver Function Tests:  Recent Labs Lab 06/26/15 1420 06/28/15 1045  AST 19 63*  ALT 12* 32  ALKPHOS 44 72  BILITOT 1.6* 1.1  PROT 6.7 5.5*  ALBUMIN 4.3 3.4*   No results for input(s): LIPASE, AMYLASE in the last 168 hours. No results for input(s): AMMONIA in the last 168 hours. Coagulation Profile:  Recent Labs Lab 06/28/15 1410  INR 1.75*   Cardiac Enzymes:  Recent Labs Lab 06/28/15 1350  CKTOTAL 1067*   BNP (last 3 results) No results for  input(s): PROBNP in the last 8760 hours. HbA1C: No results for input(s): HGBA1C in the last 72 hours. CBG: No results for input(s): GLUCAP in the last 168 hours. Lipid Profile: No results for input(s): CHOL, HDL, LDLCALC, TRIG, CHOLHDL, LDLDIRECT in the last 72 hours. Thyroid Function Tests: No results for input(s): TSH, T4TOTAL, FREET4, T3FREE, THYROIDAB in the last 72 hours. Anemia Panel: No results for input(s): VITAMINB12, FOLATE, FERRITIN, TIBC, IRON, RETICCTPCT in the last 72 hours. Sepsis Labs:  Recent Labs Lab 06/28/15 1059 06/28/15 1329 06/28/15 1410 06/28/15 1600  PROCALCITON  --   --  81.95  --   LATICACIDVEN 3.41* 2.02*  --  2.6*    Recent Results (from the past 240 hour(s))  Blood Culture (routine x 2)     Status: Abnormal   Collection Time: 06/28/15 11:00 AM  Result Value Ref Range Status   Specimen Description BLOOD LEFT ARM  Final   Special Requests BOTTLES DRAWN AEROBIC AND ANAEROBIC 5 CC  Final   Culture  Setup Time   Final    GRAM NEGATIVE RODS IN BOTH AEROBIC AND ANAEROBIC BOTTLES CRITICAL RESULT CALLED TO, READ BACK BY AND VERIFIED WITH: C.SUMME [id number]  M.MOHR    Culture (A)  Final    ESCHERICHIA COLI SUSCEPTIBILITIES PERFORMED ON PREVIOUS CULTURE WITHIN THE LAST 5 DAYS. Performed at [facility]    Report Status 07/01/2015 FINAL  Final  Blood Culture (routine x 2)     Status: Abnormal   Collection Time: 06/28/15 11:18 AM  Result Value Ref Range Status   Specimen Description BLOOD LEFT HAND  Final   Special Requests BOTTLES DRAWN AEROBIC ONLY 5CC  Final   Culture  Setup Time   Final    GRAM NEGATIVE RODS AEROBIC BOTTLE ONLY Organism ID to follow CRITICAL RESULT CALLED TO, READ BACK BY AND VERIFIED WITH: C.SUMME [id number] @ 0806  M.MOHR Performed at [facility]    Culture ESCHERICHIA COLI (A)  Final   Report Status 07/01/2015 FINAL  Final   Organism ID, Bacteria ESCHERICHIA COLI  Final      Susceptibility   Escherichia coli  - MIC*    AMPICILLIN <=2 SENSITIVE Sensitive     CEFAZOLIN <=4 SENSITIVE Sensitive     CEFEPIME <=1 SENSITIVE Sensitive     CEFTAZIDIME <=1 SENSITIVE Sensitive     CEFTRIAXONE <=1 SENSITIVE Sensitive     CIPROFLOXACIN <=0.25 SENSITIVE Sensitive     GENTAMICIN <=1 SENSITIVE Sensitive     IMIPENEM <=0.25 SENSITIVE Sensitive     TRIMETH/SULFA <=20 SENSITIVE Sensitive     AMPICILLIN/SULBACTAM <=2 SENSITIVE Sensitive     PIP/TAZO <=4 SENSITIVE Sensitive     * ESCHERICHIA COLI  Blood Culture ID Panel (Reflexed)  Status: Abnormal   Collection Time: 06/28/15 11:18 AM  Result Value Ref Range Status   Enterococcus species NOT DETECTED NOT DETECTED Final   Vancomycin resistance NOT DETECTED NOT DETECTED Final   Listeria monocytogenes NOT DETECTED NOT DETECTED Final   Staphylococcus species NOT DETECTED NOT DETECTED Final   Staphylococcus aureus NOT DETECTED NOT DETECTED Final   Methicillin resistance NOT DETECTED NOT DETECTED Final   Streptococcus species NOT DETECTED NOT DETECTED Final   Streptococcus agalactiae NOT DETECTED NOT DETECTED Final   Streptococcus pneumoniae NOT DETECTED NOT DETECTED Final   Streptococcus pyogenes NOT DETECTED NOT DETECTED Final   Acinetobacter baumannii NOT DETECTED NOT DETECTED Final   Enterobacteriaceae species DETECTED (A) NOT DETECTED Corrected    Comment: CRITICAL RESULT CALLED TO, READ BACK BY AND VERIFIED WITH: C.SUMME [id number] @0806  M.MOHR CORRECTED ON 06/21 AT 0812: PREVIOUSLY REPORTED AS DETECTED C.SUMME [id number] @0806  M.MOHR    Enterobacter cloacae complex NOT DETECTED NOT DETECTED Corrected    Comment: CORRECTED ON 06/21 AT [id number]: PREVIOUSLY REPORTED AS DETECTED CRITICAL RESULT CALLED TO, READ BACK BY AND VERIFIED WITH: C.SUMME [id number] @0806  M.MOHR   Escherichia coli DETECTED (A) NOT DETECTED Corrected    Comment: CRITICAL RESULT CALLED TO, READ BACK BY AND VERIFIED WITH: C.SUMME [id number] M.MOHR CORRECTED ON 06/21 AT [id number]: PREVIOUSLY REPORTED AS  NOT DETECTED    Klebsiella oxytoca NOT DETECTED NOT DETECTED Final   Klebsiella pneumoniae NOT DETECTED NOT DETECTED Final   Proteus species NOT DETECTED NOT DETECTED Final   Serratia marcescens NOT DETECTED NOT DETECTED Final   Carbapenem resistance NOT DETECTED NOT DETECTED Final   Haemophilus influenzae NOT DETECTED NOT DETECTED Final   Neisseria meningitidis NOT DETECTED NOT DETECTED Final   Pseudomonas aeruginosa NOT DETECTED NOT DETECTED Final   Candida albicans NOT DETECTED NOT DETECTED Final   Candida glabrata NOT DETECTED NOT DETECTED Final   Candida krusei NOT DETECTED NOT DETECTED Final   Candida parapsilosis NOT DETECTED NOT DETECTED Final   Candida tropicalis NOT DETECTED NOT DETECTED Final    Comment: Performed at [facility]  Urine culture     Status: Abnormal   Collection Time: 06/28/15  3:41 PM  Result Value Ref Range Status   Specimen Description URINE, CATHETERIZED  Final   Special Requests NONE  Final   Culture 20,000 COLONIES/mL ESCHERICHIA COLI (A)  Final   Report Status 06/30/2015 FINAL  Final   Organism ID, Bacteria ESCHERICHIA COLI (A)  Final      Susceptibility   Escherichia coli - MIC*    AMPICILLIN <=2 SENSITIVE Sensitive     CEFAZOLIN <=4 SENSITIVE Sensitive     CEFTRIAXONE <=1 SENSITIVE Sensitive     CIPROFLOXACIN <=0.25 SENSITIVE Sensitive     GENTAMICIN <=1 SENSITIVE Sensitive     IMIPENEM <=0.25 SENSITIVE Sensitive     NITROFURANTOIN <=16 SENSITIVE Sensitive     TRIMETH/SULFA <=20 SENSITIVE Sensitive     AMPICILLIN/SULBACTAM <=2 SENSITIVE Sensitive     PIP/TAZO <=4 SENSITIVE Sensitive     * 20,000 COLONIES/mL ESCHERICHIA COLI  MRSA PCR Screening     Status: None   Collection Time: 06/28/15  4:01 PM  Result Value Ref Range Status   MRSA by PCR NEGATIVE NEGATIVE Final    Comment:        The GeneXpert MRSA Assay (FDA approved for NASAL specimens only), is one component of a comprehensive MRSA colonization surveillance program. It is  not intended to diagnose MRSA infection nor to guide or monitor  treatment for MRSA infections.          Radiology Studies: No results found.      Scheduled Meds: . cefTRIAXone (ROCEPHIN)  IV  2 g Intravenous Q24H  . heparin subcutaneous  5,000 Units Subcutaneous Q8H   Continuous Infusions:    LOS: 3 days    Time spent: > 35 minutes    Penny Pia, MD [facility] Pager [phone]  If 7PM-7AM, please contact night-coverage [email or web address] Password [facility] 07/01/2015, 12:35 PM

## 2015-07-01 NOTE — Progress Notes (Signed)
Patient ID: Courtney [name], female   DOB: 1975/07/26, 40 y.o.   MRN: [id number]    Referring Physician(s): Tannenbaum,S  Supervising Physician: Ruel FavorsShick, Trevor  Patient Status:  Inpatient  Chief Complaint:  Urosepsis/obstructive right ureteral stone  Subjective:  Patient more alert and talkative today; still with some mild right flank soreness; has been out of bed to bathe this am; denies chest pain or significant dyspnea, nausea or vomiting  Allergies: Review of patient's allergies indicates no known allergies.  Medications: Prior to Admission medications   Medication Sig Start Date End Date Taking? Authorizing Provider  ibuprofen (ADVIL,MOTRIN) 600 MG tablet Take 1 tablet (600 mg total) by mouth every 6 (six) hours as needed. Patient taking differently: Take 600 mg by mouth every 6 (six) hours as needed for moderate pain.  06/26/15  Yes Tatyana Kirichenko, PA-C  ondansetron (ZOFRAN ODT) 8 MG disintegrating tablet Take 1 tablet (8 mg total) by mouth every 8 (eight) hours as needed for nausea or vomiting. 06/26/15  Yes Tatyana Kirichenko, PA-C  oxyCODONE-acetaminophen (PERCOCET) 5-325 MG tablet Take 1-2 tablets by mouth every 4 (four) hours as needed for severe pain. 06/26/15  Yes Tatyana Kirichenko, PA-C  tamsulosin (FLOMAX) 0.4 MG CAPS capsule Take 1 capsule (0.4 mg total) by mouth daily. 06/26/15  Yes Tatyana Kirichenko, PA-C     Vital Signs: BP 108/62 mmHg  Pulse 87  Temp(Src) 98.7 F (37.1 C) (Oral)  Resp 30  Ht 5\' 5"  (1.651 m)  Wt 149 lb 4 oz (67.7 kg)  BMI 24.84 kg/m2  SpO2 99%  LMP 06/28/2015  Physical Exam awake/alert. Right PCN intact, insertion site okay, mildly tender. Output 350 mL yellow urine. Cultures-Escherichia coli  Imaging: Dg Chest Port 1 View  06/28/2015  CLINICAL DATA:  Urosepsis, central line placement EXAM: PORTABLE CHEST 1 VIEW COMPARISON:  06/28/2015 FINDINGS: Cardiomediastinal silhouette is stable. Central mild vascular congestion again noted.  There is right IJ central line with tip in SVC. No pneumothorax. No segmental infiltrate. IMPRESSION: Central vascular congestion again noted. Right IJ central line in place. No pneumothorax. Electronically Signed   By: Natasha MeadLiviu  Pop M.D.   On: 06/28/2015 14:18   Dg Chest Port 1 View  06/28/2015  CLINICAL DATA:  Hypotension.  Weakness and fatigue. EXAM: PORTABLE CHEST 1 VIEW COMPARISON:  06/26/2015 CT scan FINDINGS: Mild enlargement of the cardiopericardial silhouette with indistinct upper zone pulmonary vasculature. Lungs appear otherwise clear. No pleural effusion.  No significant bony abnormality observed. IMPRESSION: 1. Mild enlargement of the cardiopericardial silhouette with suspicion for pulmonary venous hypertension. No overt edema. Electronically Signed   By: Gaylyn RongWalter  Liebkemann M.D.   On: 06/28/2015 12:56   Ir Nephrostomy Placement Right  06/28/2015  INDICATION: 40 year old female with a history of urosepsis an obstructive right ureteral stone. EXAM: IR NEPHROSTOMY PLACEMENT RIGHT COMPARISON:  None. MEDICATIONS: No additional medications; The antibiotic was administered in an appropriate time frame prior to skin puncture. ANESTHESIA/SEDATION: Fentanyl 50 mcg IV; Versed 0 mg IV The patient was continuously monitored during the procedure by the interventional radiology nurse under my direct supervision. CONTRAST:  10mL ISOVUE-300 IOPAMIDOL (ISOVUE-300) INJECTION 61% - administered into the collecting system(s) FLUOROSCOPY TIME:  Fluoroscopy Time: 1 minutes 24 seconds COMPLICATIONS: None PROCEDURE: Informed written consent was obtained from the patient and the patient's husband after a thorough discussion of the procedural risks, benefits and alternatives. All questions were addressed. Maximal Sterile Barrier Technique was utilized including caps, mask, sterile gowns, sterile gloves, sterile drape, hand hygiene and skin  antiseptic. A timeout was performed prior to the initiation of the procedure. Patient  positioned prone position on the fluoroscopy table. Ultrasound survey of the right flank was performed with images stored and sent to PACs. The patient was then prepped and draped in the usual sterile fashion. 1% lidocaine was used to anesthetize the skin and subcutaneous tissues for local anesthesia. A Chiba needle was then used to access a posterior inferior calyx with ultrasound guidance. With spontaneous urine returned through the needle, passage of an 018 micro wire into the collecting system was performed under fluoroscopy. A small incision was made with an 11 blade scalpel, and the needle was removed from the wire. An Accustick system was then advanced over the wire into the collecting system under fluoroscopy. The metal stiffener and inner dilator were removed, and then a sample of fluid was aspirated through the 4 French outer sheath. Bentson wire was passed into the collecting system and the sheath removed. Ten French dilation of the soft tissues was performed. Using modified Seldinger technique, a 10 French pigtail catheter drain was placed over the Bentson wire. Wire and inner stiffener removed, and the pigtail was formed in the collecting system. Small amount of contrast confirmed position of the catheter. Sample was aspirated for culture. Patient tolerated the procedure well and remained hemodynamically stable throughout. No complications were encountered and no significant blood loss encountered IMPRESSION: Status post image guided right percutaneous nephrostomy placement with a 10 French drain. Sample sent to the lab for analysis. Signed, Yvone NeuJaime S. Loreta AveWagner, DO [facility] Electronically Signed   By: Gilmer MorJaime  Wagner D.O.   On: 06/28/2015 15:56    Labs:  CBC:  Recent Labs  06/28/15 1045 06/29/15 0350 06/30/15 0400 07/01/15 0309  WBC 14.0* 14.7* 17.2* 21.9*  HGB 9.8* 9.5* 9.3* 9.6*  HCT 28.3* 27.2* 26.6* 27.8*  PLT 51* 47* 58* 67*     COAGS:  Recent Labs  06/28/15 1410  INR 1.75*  APTT 40*    BMP:  Recent Labs  06/28/15 1410 06/29/15 0350 06/30/15 0400 07/01/15 0309  NA 133* 137 140 140  K 4.6 3.7 3.4* 3.4*  CL 109 114* 117* 115*  CO2 15* 16* 19* 20*  GLUCOSE 89 77 91 100*  BUN 49* 43* 29* 17  CALCIUM 5.6* 6.9* 7.7* 7.7*  CREATININE 3.53* 2.44* 1.04* 0.66  GFRNONAA 15* 24* >60 >60  GFRAA 18* 28* >60 >60    LIVER FUNCTION TESTS:  Recent Labs  06/26/15 1420 06/28/15 1045  BILITOT 1.6* 1.1  AST 19 63*  ALT 12* 32  ALKPHOS 44 72  PROT 6.7 5.5*  ALBUMIN 4.3 3.4*    Assessment and Plan:              S/p right PCN 6/20 for obst rt ureteral stone/hydro/sepsis; AF; WBC 21.9(17.2), HGB 9.6, PLTS 67K; CREAT NL; antbx per primary team, cont PCN as per urology plans  Electronically Signed: D. Jeananne RamaKevin Allred 07/01/2015, 11:17 AM   I spent a total of 15 minutes at the the patient's bedside AND on the patient's hospital floor or unit, greater than 50% of which was counseling/coordinating care for right nephrostomy

## 2015-07-02 LAB — CBC
HCT: 27.3 % — ABNORMAL LOW (ref 36.0–46.0)
Hemoglobin: 9.5 g/dL — ABNORMAL LOW (ref 12.0–15.0)
MCH: 28.8 pg (ref 26.0–34.0)
MCHC: 34.8 g/dL (ref 30.0–36.0)
MCV: 82.7 fL (ref 78.0–100.0)
PLATELETS: 92 10*3/uL — AB (ref 150–400)
RBC: 3.3 MIL/uL — AB (ref 3.87–5.11)
RDW: 13.7 % (ref 11.5–15.5)
WBC: 19.8 10*3/uL — AB (ref 4.0–10.5)

## 2015-07-02 MED ORDER — ALBUTEROL SULFATE (2.5 MG/3ML) 0.083% IN NEBU
2.5000 mg | INHALATION_SOLUTION | Freq: Four times a day (QID) | RESPIRATORY_TRACT | Status: DC | PRN
  Administered 2015-07-02 (×2): 2.5 mg via RESPIRATORY_TRACT
  Filled 2015-07-02 (×2): qty 3

## 2015-07-02 NOTE — Progress Notes (Signed)
PROGRESS NOTE    Courtney [name]  ZOX:[id number] DOB: [birth year] DOA: 06/28/2015 PCP: Nicki Reaper, NP   Brief Narrative:  Patient is a 40 year old who presented with Escherichia coli urosepsis secondary to 6 mm right lower ureteral stone. Patient initially was seen and had septic shock and required pressors. Critical care initially admitted patient and subsequently patient after improvement has been transitioned to our team.  Assessment & Plan:   Active Problems:   Septic shock (HCC) - Resolving - Per discussion with pharmacy will transition patient to ampicillin discontinue Rocephin - Urine culture reporting Escherichia coli - Blood culture reporting Escherichia coli. Patient will require 14 days of antibiotic therapy - d/c continuous pulse ox and cardiac monitoring  Escherichia coli bacteremia - For now we'll continue IV ampicillin with plans to transition to oral antibiotic regimen with continued improvement in condition and resolution of fevers and leukocytosis. - Patient will need 14 days total of antibiotics. Today being day 5 of 14  Ureteral stone - Urology managing. IR consulted and per tube placed. - Plan is to allow patient to heal for about 3 weeks at home before definitive stone surgery   DVT prophylaxis: Heparin Code Status: Full Family Communication: None at bedside Disposition Plan: Pending improvement in condition   Consultants:   Radiology/  urology   Procedures: Percutaneous tube   Antimicrobials: Ampicillin   Subjective: Patient feels better today.  Objective: Filed Vitals:   07/01/15 1243 07/01/15 1954 07/01/15 2137 07/02/15 0459  BP: 115/87  111/71 115/73  Pulse: 97  100 92  Temp: 98.6 F (37 C)  99.9 F (37.7 C) 99.4 F (37.4 C)  TempSrc: Oral  Oral Oral  Resp: 20  20 22   Height:      Weight:      SpO2: 100% 98% 96% 95%    Intake/Output Summary (Last 24 hours) at 07/02/15 1506 Last data filed at 07/02/15 1340  Gross per 24  hour  Intake    740 ml  Output   1135 ml  Net   -395 ml   Filed Weights   06/28/15 1545 06/29/15 0500 06/30/15 0400  Weight: 66.7 kg (147 lb 0.8 oz) 68.9 kg (151 lb 14.4 oz) 67.7 kg (149 lb 4 oz)    Examination:  General exam: Appears calm and comfortable  Respiratory system: Clear to auscultation. Respiratory effort normal. Cardiovascular system: S1 & S2 heard, RRR. Gastrointestinal system: Abdomen is nondistended, soft and nontender. Central nervous system: Alert and oriented. No focal neurological deficits. Extremities: Symmetric 5 x 5 power. Skin: No rashes, lesions or ulcers on limited exam Psychiatry: Judgement and insight appear normal. Mood & affect appropriate.     Data Reviewed: I have personally reviewed following labs and imaging studies  CBC:  Recent Labs Lab 06/26/15 1420 06/28/15 1045 06/29/15 0350 06/30/15 0400 07/01/15 0309 07/02/15 0527  WBC 2.7* 14.0* 14.7* 17.2* 21.9* 19.8*  NEUTROABS 2.4 13.1*  --   --   --   --   HGB 12.2 9.8* 9.5* 9.3* 9.6* 9.5*  HCT 37.2 28.3* 27.2* 26.6* 27.8* 27.3*  MCV 87.9 83.2 84.5 83.9 84.5 82.7  PLT 155 51* 47* 58* 67* 92*   Basic Metabolic Panel:  Recent Labs Lab 06/28/15 1045 06/28/15 1410 06/29/15 0350 06/30/15 0400 07/01/15 0309  NA 129* 133* 137 140 140  K 5.1 4.6 3.7 3.4* 3.4*  CL 97* 109 114* 117* 115*  CO2 19* 15* 16* 19* 20*  GLUCOSE 83 89 77 91 100*  BUN 53* 49* 43* 29* 17  CREATININE 4.53* 3.53* 2.44* 1.04* 0.66  CALCIUM 6.8* 5.6* 6.9* 7.7* 7.7*  MG  --  1.2* 1.5*  --   --   PHOS  --  3.9 3.7  --   --    GFR: Estimated Creatinine Clearance: 85.8 mL/min (by C-G formula based on Cr of 0.66). Liver Function Tests:  Recent Labs Lab 06/26/15 1420 06/28/15 1045  AST 19 63*  ALT 12* 32  ALKPHOS 44 72  BILITOT 1.6* 1.1  PROT 6.7 5.5*  ALBUMIN 4.3 3.4*   No results for input(s): LIPASE, AMYLASE in the last 168 hours. No results for input(s): AMMONIA in the last 168 hours. Coagulation  Profile:  Recent Labs Lab 06/28/15 1410  INR 1.75*   Cardiac Enzymes:  Recent Labs Lab 06/28/15 1350  CKTOTAL 1067*   BNP (last 3 results) No results for input(s): PROBNP in the last 8760 hours. HbA1C: No results for input(s): HGBA1C in the last 72 hours. CBG: No results for input(s): GLUCAP in the last 168 hours. Lipid Profile: No results for input(s): CHOL, HDL, LDLCALC, TRIG, CHOLHDL, LDLDIRECT in the last 72 hours. Thyroid Function Tests: No results for input(s): TSH, T4TOTAL, FREET4, T3FREE, THYROIDAB in the last 72 hours. Anemia Panel: No results for input(s): VITAMINB12, FOLATE, FERRITIN, TIBC, IRON, RETICCTPCT in the last 72 hours. Sepsis Labs:  Recent Labs Lab 06/28/15 1059 06/28/15 1329 06/28/15 1410 06/28/15 1600  PROCALCITON  --   --  81.95  --   LATICACIDVEN 3.41* 2.02*  --  2.6*    Recent Results (from the past 240 hour(s))  Blood Culture (routine x 2)     Status: Abnormal   Collection Time: 06/28/15 11:00 AM  Result Value Ref Range Status   Specimen Description BLOOD LEFT ARM  Final   Special Requests BOTTLES DRAWN AEROBIC AND ANAEROBIC 5 CC  Final   Culture  Setup Time   Final    GRAM NEGATIVE RODS IN BOTH AEROBIC AND ANAEROBIC BOTTLES CRITICAL RESULT CALLED TO, READ BACK BY AND VERIFIED WITH: C.SUMME [id number] @0806  M.MOHR    Culture (A)  Final    ESCHERICHIA COLI SUSCEPTIBILITIES PERFORMED ON PREVIOUS CULTURE WITHIN THE LAST 5 DAYS. Performed [facility]    Report Status 07/01/2015 FINAL  Final  Blood Culture (routine x 2)     Status: Abnormal   Collection Time: 06/28/15 11:18 AM  Result Value Ref Range Status   Specimen Description BLOOD LEFT HAND  Final   Special Requests BOTTLES DRAWN AEROBIC ONLY 5CC  Final   Culture  Setup Time   Final    GRAM NEGATIVE RODS AEROBIC BOTTLE ONLY Organism ID to follow CRITICAL RESULT CALLED TO, READ BACK BY AND VERIFIED WITH: C.SUMME [id number] @ 0806  M.MOHR Performed [facility]      Culture ESCHERICHIA COLI (A)  Final   Report Status 07/01/2015 FINAL  Final   Organism ID, Bacteria ESCHERICHIA COLI  Final      Susceptibility   Escherichia coli - MIC*    AMPICILLIN <=2 SENSITIVE Sensitive     CEFAZOLIN <=4 SENSITIVE Sensitive     CEFEPIME <=1 SENSITIVE Sensitive     CEFTAZIDIME <=1 SENSITIVE Sensitive     CEFTRIAXONE <=1 SENSITIVE Sensitive     CIPROFLOXACIN <=0.25 SENSITIVE Sensitive     GENTAMICIN <=1 SENSITIVE Sensitive     IMIPENEM <=0.25 SENSITIVE Sensitive     TRIMETH/SULFA <=20 SENSITIVE Sensitive     AMPICILLIN/SULBACTAM <=  2 SENSITIVE Sensitive     PIP/TAZO <=4 SENSITIVE Sensitive     * ESCHERICHIA COLI  Blood Culture ID Panel (Reflexed)     Status: Abnormal   Collection Time: 06/28/15 11:18 AM  Result Value Ref Range Status   Enterococcus species NOT DETECTED NOT DETECTED Final   Vancomycin resistance NOT DETECTED NOT DETECTED Final   Listeria monocytogenes NOT DETECTED NOT DETECTED Final   Staphylococcus species NOT DETECTED NOT DETECTED Final   Staphylococcus aureus NOT DETECTED NOT DETECTED Final   Methicillin resistance NOT DETECTED NOT DETECTED Final   Streptococcus species NOT DETECTED NOT DETECTED Final   Streptococcus agalactiae NOT DETECTED NOT DETECTED Final   Streptococcus pneumoniae NOT DETECTED NOT DETECTED Final   Streptococcus pyogenes NOT DETECTED NOT DETECTED Final   Acinetobacter baumannii NOT DETECTED NOT DETECTED Final   Enterobacteriaceae species DETECTED (A) NOT DETECTED Corrected    Comment: CRITICAL RESULT CALLED TO, READ BACK BY AND VERIFIED WITH: C.SUMME [id number]  M.MOHR CORRECTED ON 06/21 AT 0812: PREVIOUSLY REPORTED AS DETECTED C.SUMME [id number]  M.MOHR    Enterobacter cloacae complex NOT DETECTED NOT DETECTED Corrected    Comment: CORRECTED ON 06/21 AT 6213: PREVIOUSLY REPORTED AS DETECTED CRITICAL RESULT CALLED TO, READ BACK BY AND VERIFIED WITH: C.SUMME [id number]  M.MOHR   Escherichia coli DETECTED (A) NOT  DETECTED Corrected    Comment: CRITICAL RESULT CALLED TO, READ BACK BY AND VERIFIED WITH: C.SUMME [id number] M.MOHR CORRECTED ON 06/21 AT 5284: PREVIOUSLY REPORTED AS NOT DETECTED    Klebsiella oxytoca NOT DETECTED NOT DETECTED Final   Klebsiella pneumoniae NOT DETECTED NOT DETECTED Final   Proteus species NOT DETECTED NOT DETECTED Final   Serratia marcescens NOT DETECTED NOT DETECTED Final   Carbapenem resistance NOT DETECTED NOT DETECTED Final   Haemophilus influenzae NOT DETECTED NOT DETECTED Final   Neisseria meningitidis NOT DETECTED NOT DETECTED Final   Pseudomonas aeruginosa NOT DETECTED NOT DETECTED Final   Candida albicans NOT DETECTED NOT DETECTED Final   Candida glabrata NOT DETECTED NOT DETECTED Final   Candida krusei NOT DETECTED NOT DETECTED Final   Candida parapsilosis NOT DETECTED NOT DETECTED Final   Candida tropicalis NOT DETECTED NOT DETECTED Final    Comment: Performed at [facility]  Urine culture     Status: Abnormal   Collection Time: 06/28/15  3:41 PM  Result Value Ref Range Status   Specimen Description URINE, CATHETERIZED  Final   Special Requests NONE  Final   Culture 20,000 COLONIES/mL ESCHERICHIA COLI (A)  Final   Report Status 06/30/2015 FINAL  Final   Organism ID, Bacteria ESCHERICHIA COLI (A)  Final      Susceptibility   Escherichia coli - MIC*    AMPICILLIN <=2 SENSITIVE Sensitive     CEFAZOLIN <=4 SENSITIVE Sensitive     CEFTRIAXONE <=1 SENSITIVE Sensitive     CIPROFLOXACIN <=0.25 SENSITIVE Sensitive     GENTAMICIN <=1 SENSITIVE Sensitive     IMIPENEM <=0.25 SENSITIVE Sensitive     NITROFURANTOIN <=16 SENSITIVE Sensitive     TRIMETH/SULFA <=20 SENSITIVE Sensitive     AMPICILLIN/SULBACTAM <=2 SENSITIVE Sensitive     PIP/TAZO <=4 SENSITIVE Sensitive     * 20,000 COLONIES/mL ESCHERICHIA COLI  MRSA PCR Screening     Status: None   Collection Time: 06/28/15  4:01 PM  Result Value Ref Range Status   MRSA by PCR NEGATIVE NEGATIVE Final     Comment:        The GeneXpert MRSA Assay (FDA  approved for NASAL specimens only), is one component of a comprehensive MRSA colonization surveillance program. It is not intended to diagnose MRSA infection nor to guide or monitor treatment for MRSA infections.          Radiology Studies: No results found.      Scheduled Meds: . ampicillin (OMNIPEN) IV  1 g Intravenous Q6H  . heparin subcutaneous  5,000 Units Subcutaneous Q8H   Continuous Infusions:    LOS: 4 days    Time spent: > 35 minutes    Penny PiaVEGA, Ygnacio Fecteau, MD [facility] Pager [phone]  If 7PM-7AM, please contact night-coverage [email or web address] Password [email or web address] 07/02/2015, 3:06 PM

## 2015-07-03 NOTE — Progress Notes (Signed)
    Referring Physician(s): Dr Penny Piarlando Vega  Supervising Physician: Irish LackYamagata, Glenn  Patient Status:  Inpatient  Chief Complaint:  Rt percutaneous nephrostomy tube placed 6/20 in IR Obstructive ureteral stone  Subjective:  Some better today Has been up to restroom in room Diarrhea today urinating well   Allergies: Review of patient's allergies indicates no known allergies.  Medications: Prior to Admission medications   Medication Sig Start Date End Date Taking? Authorizing Provider  ibuprofen (ADVIL,MOTRIN) 600 MG tablet Take 1 tablet (600 mg total) by mouth every 6 (six) hours as needed. Patient taking differently: Take 600 mg by mouth every 6 (six) hours as needed for moderate pain.  06/26/15  Yes Tatyana Kirichenko, PA-C  ondansetron (ZOFRAN ODT) 8 MG disintegrating tablet Take 1 tablet (8 mg total) by mouth every 8 (eight) hours as needed for nausea or vomiting. 06/26/15  Yes Tatyana Kirichenko, PA-C  oxyCODONE-acetaminophen (PERCOCET) 5-325 MG tablet Take 1-2 tablets by mouth every 4 (four) hours as needed for severe pain. 06/26/15  Yes Tatyana Kirichenko, PA-C  tamsulosin (FLOMAX) 0.4 MG CAPS capsule Take 1 capsule (0.4 mg total) by mouth daily. 06/26/15  Yes Tatyana Kirichenko, PA-C     Vital Signs: BP 117/70 mmHg  Pulse 83  Temp(Src) 98.4 F (36.9 C) (Oral)  Resp 30  Ht 5\' 5"  (1.651 m)  Wt 154 lb 12.2 oz (70.2 kg)  BMI 25.75 kg/m2  SpO2 97%  LMP 06/28/2015  Physical Exam  Abdominal: Soft.  Neurological: She is alert.  Skin: Skin is warm and dry.  Site of rt PCN clean and dry Sl tender No bleeding Output 825 cc yesterday 200 cc now in bag; dark yellow T max 100.3 last pm; now afeb Cr 0.66  Psychiatric: She has a normal mood and affect. Her behavior is normal.  Vitals reviewed.   Imaging: No results found.  Labs:  CBC:  Recent Labs  06/29/15 0350 06/30/15 0400 07/01/15 0309 07/02/15 0527  WBC 14.7* 17.2* 21.9* 19.8*  HGB 9.5* 9.3* 9.6*  9.5*  HCT 27.2* 26.6* 27.8* 27.3*  PLT 47* 58* 67* 92*    COAGS:  Recent Labs  06/28/15 1410  INR 1.75*  APTT 40*    BMP:  Recent Labs  06/28/15 1410 06/29/15 0350 06/30/15 0400 07/01/15 0309  NA 133* 137 140 140  K 4.6 3.7 3.4* 3.4*  CL 109 114* 117* 115*  CO2 15* 16* 19* 20*  GLUCOSE 89 77 91 100*  BUN 49* 43* 29* 17  CALCIUM 5.6* 6.9* 7.7* 7.7*  CREATININE 3.53* 2.44* 1.04* 0.66  GFRNONAA 15* 24* >60 >60  GFRAA 18* 28* >60 >60    LIVER FUNCTION TESTS:  Recent Labs  06/26/15 1420 06/28/15 1045  BILITOT 1.6* 1.1  AST 19 63*  ALT 12* 32  ALKPHOS 44 72  PROT 6.7 5.5*  ALBUMIN 4.3 3.4*    Assessment and Plan:  Rt obstructive ureteral stone Rt PCN placed 6/20 Will follow Plan per TRH and CCM  Electronically Signed: Zhi Geier A 07/03/2015, 10:57 AM   I spent a total of 15 Minutes at the the patient's bedside AND on the patient's hospital floor or unit, greater than 50% of which was counseling/coordinating care for Rt PCN placement

## 2015-07-03 NOTE — Progress Notes (Signed)
PROGRESS NOTE    Courtney [name]  ZOX:[id number] DOB: [birth year] DOA: 06/28/2015 PCP: Nicki Reaper, NP   Brief Narrative:  Patient is a 40 year old who presented with Escherichia coli urosepsis secondary to 6 mm right lower ureteral stone. Patient initially was seen and had septic shock and required pressors. Critical care initially admitted patient and subsequently patient after improvement has been transitioned to our team.  Assessment & Plan:   Active Problems:   Septic shock (HCC) - Resolving - Per discussion with pharmacy will transition patient to ampicillin discontinue Rocephin - Urine culture reporting Escherichia coli - Blood culture reporting Escherichia coli. Patient will require 14 days of antibiotic therapy - d/c continuous pulse ox and cardiac monitoring - remove foley catheter.   Escherichia coli bacteremia - For now we'll continue IV ampicillin with plans to transition to oral antibiotic regimen with continued improvement in condition and resolution of fevers and leukocytosis. - Patient will need 14 days total of antibiotics. Today being day 6 of 14  Ureteral stone - Urology managing. IR consulted and percuteneous nephrostomy tube placed. - Plan is to allow patient to heal for about 3 weeks at home before definitive stone surgery   DVT prophylaxis: Heparin Code Status: Full Family Communication: None at bedside Disposition Plan: Pending improvement in condition   Consultants:   Radiology/  urology   Procedures: Percutaneous tube   Antimicrobials: Ampicillin   Subjective: Patient feels better today.  Objective: Filed Vitals:   07/02/15 1843 07/02/15 2126 07/03/15 0545 07/03/15 0612  BP:  116/59  117/70  Pulse:  81  83  Temp: 98.6 F (37 C) 98.1 F (36.7 C)  98.4 F (36.9 C)  TempSrc: Oral Oral  Oral  Resp:  20  30  Height:      Weight:   70.2 kg (154 lb 12.2 oz)   SpO2:  99%  97%    Intake/Output Summary (Last 24 hours) at 07/03/15  1430 Last data filed at 07/03/15 1135  Gross per 24 hour  Intake    240 ml  Output   1750 ml  Net  -1510 ml   Filed Weights   06/29/15 0500 06/30/15 0400 07/03/15 0545  Weight: 68.9 kg (151 lb 14.4 oz) 67.7 kg (149 lb 4 oz) 70.2 kg (154 lb 12.2 oz)    Examination:  General exam: Appears calm and comfortable  Respiratory system: Clear to auscultation. Respiratory effort normal. Cardiovascular system: S1 & S2 heard, RRR. Gastrointestinal system: Abdomen is nondistended, soft and nontender. Central nervous system: Alert and oriented. No focal neurological deficits. Extremities: Symmetric 5 x 5 power. Skin: No rashes, lesions or ulcers on limited exam Psychiatry: Judgement and insight appear normal. Mood & affect appropriate.     Data Reviewed: I have personally reviewed following labs and imaging studies  CBC:  Recent Labs Lab 06/28/15 1045 06/29/15 0350 06/30/15 0400 07/01/15 0309 07/02/15 0527  WBC 14.0* 14.7* 17.2* 21.9* 19.8*  NEUTROABS 13.1*  --   --   --   --   HGB 9.8* 9.5* 9.3* 9.6* 9.5*  HCT 28.3* 27.2* 26.6* 27.8* 27.3*  MCV 83.2 84.5 83.9 84.5 82.7  PLT 51* 47* 58* 67* 92*   Basic Metabolic Panel:  Recent Labs Lab 06/28/15 1045 06/28/15 1410 06/29/15 0350 06/30/15 0400 07/01/15 0309  NA 129* 133* 137 140 140  K 5.1 4.6 3.7 3.4* 3.4*  CL 97* 109 114* 117* 115*  CO2 19* 15* 16* 19* 20*  GLUCOSE 83 89 77 91  100*  BUN 53* 49* 43* 29* 17  CREATININE 4.53* 3.53* 2.44* 1.04* 0.66  CALCIUM 6.8* 5.6* 6.9* 7.7* 7.7*  MG  --  1.2* 1.5*  --   --   PHOS  --  3.9 3.7  --   --    GFR: Estimated Creatinine Clearance: 93.8 mL/min (by C-G formula based on Cr of 0.66). Liver Function Tests:  Recent Labs Lab 06/28/15 1045  AST 63*  ALT 32  ALKPHOS 72  BILITOT 1.1  PROT 5.5*  ALBUMIN 3.4*   No results for input(s): LIPASE, AMYLASE in the last 168 hours. No results for input(s): AMMONIA in the last 168 hours. Coagulation Profile:  Recent Labs Lab  06/28/15 1410  INR 1.75*   Cardiac Enzymes:  Recent Labs Lab 06/28/15 1350  CKTOTAL 1067*   BNP (last 3 results) No results for input(s): PROBNP in the last 8760 hours. HbA1C: No results for input(s): HGBA1C in the last 72 hours. CBG: No results for input(s): GLUCAP in the last 168 hours. Lipid Profile: No results for input(s): CHOL, HDL, LDLCALC, TRIG, CHOLHDL, LDLDIRECT in the last 72 hours. Thyroid Function Tests: No results for input(s): TSH, T4TOTAL, FREET4, T3FREE, THYROIDAB in the last 72 hours. Anemia Panel: No results for input(s): VITAMINB12, FOLATE, FERRITIN, TIBC, IRON, RETICCTPCT in the last 72 hours. Sepsis Labs:  Recent Labs Lab 06/28/15 1059 06/28/15 1329 06/28/15 1410 06/28/15 1600  PROCALCITON  --   --  81.95  --   LATICACIDVEN 3.41* 2.02*  --  2.6*    Recent Results (from the past 240 hour(s))  Blood Culture (routine x 2)     Status: Abnormal   Collection Time: 06/28/15 11:00 AM  Result Value Ref Range Status   Specimen Description BLOOD LEFT ARM  Final   Special Requests BOTTLES DRAWN AEROBIC AND ANAEROBIC 5 CC  Final   Culture  Setup Time   Final    GRAM NEGATIVE RODS IN BOTH AEROBIC AND ANAEROBIC BOTTLES CRITICAL RESULT CALLED TO, READ BACK BY AND VERIFIED WITH: C.SUMME [id number]  M.MOHR    Culture (A)  Final    ESCHERICHIA COLI SUSCEPTIBILITIES PERFORMED ON PREVIOUS CULTURE WITHIN THE LAST 5 DAYS. Performed at [facility]    Report Status 07/01/2015 FINAL  Final  Blood Culture (routine x 2)     Status: Abnormal   Collection Time: 06/28/15 11:18 AM  Result Value Ref Range Status   Specimen Description BLOOD LEFT HAND  Final   Special Requests BOTTLES DRAWN AEROBIC ONLY 5CC  Final   Culture  Setup Time   Final    GRAM NEGATIVE RODS AEROBIC BOTTLE ONLY Organism ID to follow CRITICAL RESULT CALLED TO, READ BACK BY AND VERIFIED WITH: C.SUMME [id number] @ 0806  M.MOHR Performed at [facility]    Culture ESCHERICHIA COLI  (A)  Final   Report Status 07/01/2015 FINAL  Final   Organism ID, Bacteria ESCHERICHIA COLI  Final      Susceptibility   Escherichia coli - MIC*    AMPICILLIN <=2 SENSITIVE Sensitive     CEFAZOLIN <=4 SENSITIVE Sensitive     CEFEPIME <=1 SENSITIVE Sensitive     CEFTAZIDIME <=1 SENSITIVE Sensitive     CEFTRIAXONE <=1 SENSITIVE Sensitive     CIPROFLOXACIN <=0.25 SENSITIVE Sensitive     GENTAMICIN <=1 SENSITIVE Sensitive     IMIPENEM <=0.25 SENSITIVE Sensitive     TRIMETH/SULFA <=20 SENSITIVE Sensitive     AMPICILLIN/SULBACTAM <=2 SENSITIVE Sensitive  PIP/TAZO <=4 SENSITIVE Sensitive     * ESCHERICHIA COLI  Blood Culture ID Panel (Reflexed)     Status: Abnormal   Collection Time: 06/28/15 11:18 AM  Result Value Ref Range Status   Enterococcus species NOT DETECTED NOT DETECTED Final   Vancomycin resistance NOT DETECTED NOT DETECTED Final   Listeria monocytogenes NOT DETECTED NOT DETECTED Final   Staphylococcus species NOT DETECTED NOT DETECTED Final   Staphylococcus aureus NOT DETECTED NOT DETECTED Final   Methicillin resistance NOT DETECTED NOT DETECTED Final   Streptococcus species NOT DETECTED NOT DETECTED Final   Streptococcus agalactiae NOT DETECTED NOT DETECTED Final   Streptococcus pneumoniae NOT DETECTED NOT DETECTED Final   Streptococcus pyogenes NOT DETECTED NOT DETECTED Final   Acinetobacter baumannii NOT DETECTED NOT DETECTED Final   Enterobacteriaceae species DETECTED (A) NOT DETECTED Corrected    Comment: CRITICAL RESULT CALLED TO, READ BACK BY AND VERIFIED WITH: C.SUMME [id number] @0806  M.MOHR CORRECTED ON 06/21 AT 0812: PREVIOUSLY REPORTED AS DETECTED C.SUMME [id number] @0806  M.MOHR    Enterobacter cloacae complex NOT DETECTED NOT DETECTED Corrected    Comment: CORRECTED ON 06/21 AT [id number]: PREVIOUSLY REPORTED AS DETECTED CRITICAL RESULT CALLED TO, READ BACK BY AND VERIFIED WITH: C.SUMME [id number] @0806  M.MOHR   Escherichia coli DETECTED (A) NOT DETECTED Corrected    Comment:  CRITICAL RESULT CALLED TO, READ BACK BY AND VERIFIED WITH: C.SUMME [phone] M.MOHR CORRECTED ON 06/21 AT [id number]: PREVIOUSLY REPORTED AS NOT DETECTED    Klebsiella oxytoca NOT DETECTED NOT DETECTED Final   Klebsiella pneumoniae NOT DETECTED NOT DETECTED Final   Proteus species NOT DETECTED NOT DETECTED Final   Serratia marcescens NOT DETECTED NOT DETECTED Final   Carbapenem resistance NOT DETECTED NOT DETECTED Final   Haemophilus influenzae NOT DETECTED NOT DETECTED Final   Neisseria meningitidis NOT DETECTED NOT DETECTED Final   Pseudomonas aeruginosa NOT DETECTED NOT DETECTED Final   Candida albicans NOT DETECTED NOT DETECTED Final   Candida glabrata NOT DETECTED NOT DETECTED Final   Candida krusei NOT DETECTED NOT DETECTED Final   Candida parapsilosis NOT DETECTED NOT DETECTED Final   Candida tropicalis NOT DETECTED NOT DETECTED Final    Comment: Performed at [facility]  Urine culture     Status: Abnormal   Collection Time: 06/28/15  3:41 PM  Result Value Ref Range Status   Specimen Description URINE, CATHETERIZED  Final   Special Requests NONE  Final   Culture 20,000 COLONIES/mL ESCHERICHIA COLI (A)  Final   Report Status 06/30/2015 FINAL  Final   Organism ID, Bacteria ESCHERICHIA COLI (A)  Final      Susceptibility   Escherichia coli - MIC*    AMPICILLIN <=2 SENSITIVE Sensitive     CEFAZOLIN <=4 SENSITIVE Sensitive     CEFTRIAXONE <=1 SENSITIVE Sensitive     CIPROFLOXACIN <=0.25 SENSITIVE Sensitive     GENTAMICIN <=1 SENSITIVE Sensitive     IMIPENEM <=0.25 SENSITIVE Sensitive     NITROFURANTOIN <=16 SENSITIVE Sensitive     TRIMETH/SULFA <=20 SENSITIVE Sensitive     AMPICILLIN/SULBACTAM <=2 SENSITIVE Sensitive     PIP/TAZO <=4 SENSITIVE Sensitive     * 20,000 COLONIES/mL ESCHERICHIA COLI  MRSA PCR Screening     Status: None   Collection Time: 06/28/15  4:01 PM  Result Value Ref Range Status   MRSA by PCR NEGATIVE NEGATIVE Final    Comment:        The GeneXpert  MRSA Assay (FDA approved for NASAL specimens only), is one  component of a comprehensive MRSA colonization surveillance program. It is not intended to diagnose MRSA infection nor to guide or monitor treatment for MRSA infections.          Radiology Studies: No results found.      Scheduled Meds: . ampicillin (OMNIPEN) IV  1 g Intravenous Q6H  . heparin subcutaneous  5,000 Units Subcutaneous Q8H   Continuous Infusions:    LOS: 5 days    Time spent: > 35 minutes    Penny PiaVEGA, Brandyce Dimario, MD [facility] Pager [phone]  If 7PM-7AM, please contact night-coverage [email or web address] Password [email or web address] 07/03/2015, 2:30 PM

## 2015-07-04 LAB — BASIC METABOLIC PANEL
ANION GAP: 4 — AB (ref 5–15)
BUN: 8 mg/dL (ref 6–20)
CALCIUM: 8.1 mg/dL — AB (ref 8.9–10.3)
CO2: 24 mmol/L (ref 22–32)
CREATININE: 0.47 mg/dL (ref 0.44–1.00)
Chloride: 112 mmol/L — ABNORMAL HIGH (ref 101–111)
GLUCOSE: 88 mg/dL (ref 65–99)
Potassium: 3.6 mmol/L (ref 3.5–5.1)
Sodium: 140 mmol/L (ref 135–145)

## 2015-07-04 LAB — CBC
HCT: 29.3 % — ABNORMAL LOW (ref 36.0–46.0)
Hemoglobin: 10 g/dL — ABNORMAL LOW (ref 12.0–15.0)
MCH: 29.2 pg (ref 26.0–34.0)
MCHC: 34.1 g/dL (ref 30.0–36.0)
MCV: 85.4 fL (ref 78.0–100.0)
PLATELETS: 190 10*3/uL (ref 150–400)
RBC: 3.43 MIL/uL — ABNORMAL LOW (ref 3.87–5.11)
RDW: 13.8 % (ref 11.5–15.5)
WBC: 18.1 10*3/uL — AB (ref 4.0–10.5)

## 2015-07-04 NOTE — Progress Notes (Signed)
Patient ID: Courtney LennoxMargarita E [name], female   DOB: 10-02-1975, 40 y.o.   MRN: [id number]    Referring Physician(s): Tannebaum,S  Supervising Physician: Oley BalmHassell, Daniel  Patient Status:  Inpatient  Chief Complaint: Urosepsis/obstructive right ureteral stone   Subjective: Pt doing ok; has some rt flank soreness; now c/o rt hip pain when bearing weight; denies paresthesias/LE swelling  Allergies: Review of patient's allergies indicates no known allergies.  Medications: Prior to Admission medications   Medication Sig Start Date End Date Taking? Authorizing Provider  ibuprofen (ADVIL,MOTRIN) 600 MG tablet Take 1 tablet (600 mg total) by mouth every 6 (six) hours as needed. Patient taking differently: Take 600 mg by mouth every 6 (six) hours as needed for moderate pain.  06/26/15  Yes Tatyana Kirichenko, PA-C  ondansetron (ZOFRAN ODT) 8 MG disintegrating tablet Take 1 tablet (8 mg total) by mouth every 8 (eight) hours as needed for nausea or vomiting. 06/26/15  Yes Tatyana Kirichenko, PA-C  oxyCODONE-acetaminophen (PERCOCET) 5-325 MG tablet Take 1-2 tablets by mouth every 4 (four) hours as needed for severe pain. 06/26/15  Yes Tatyana Kirichenko, PA-C  tamsulosin (FLOMAX) 0.4 MG CAPS capsule Take 1 capsule (0.4 mg total) by mouth daily. 06/26/15  Yes Tatyana Kirichenko, PA-C     Vital Signs: BP 109/69 mmHg  Pulse 67  Temp(Src) 98.9 F (37.2 C) (Oral)  Resp 20  Ht 5\' 5"  (1.651 m)  Wt 149 lb 11.1 oz (67.9 kg)  BMI 24.91 kg/m2  SpO2 96%  LMP 06/28/2015  Physical Exam right PCN intact, insertion site ok; mildly tender; output- 950 cc yellow urine   Imaging: No results found.  Labs:  CBC:  Recent Labs  06/30/15 0400 07/01/15 0309 07/02/15 0527 07/04/15 0512  WBC 17.2* 21.9* 19.8* 18.1*  HGB 9.3* 9.6* 9.5* 10.0*  HCT 26.6* 27.8* 27.3* 29.3*  PLT 58* 67* 92* 190    COAGS:  Recent Labs  06/28/15 1410  INR 1.75*  APTT 40*    BMP:  Recent Labs  06/29/15 0350  06/30/15 0400 07/01/15 0309 07/04/15 0512  NA 137 140 140 140  K 3.7 3.4* 3.4* 3.6  CL 114* 117* 115* 112*  CO2 16* 19* 20* 24  GLUCOSE 77 91 100* 88  BUN 43* 29* 17 8  CALCIUM 6.9* 7.7* 7.7* 8.1*  CREATININE 2.44* 1.04* 0.66 0.47  GFRNONAA 24* >60 >60 >60  GFRAA 28* >60 >60 >60    LIVER FUNCTION TESTS:  Recent Labs  06/26/15 1420 06/28/15 1045  BILITOT 1.6* 1.1  AST 19 63*  ALT 12* 32  ALKPHOS 44 72  PROT 6.7 5.5*  ALBUMIN 4.3 3.4*    Assessment and Plan: S/p right PCN 6/20 for obstructive rt ureteral stone/hydro/sepsis; cx's - e coli; AF; WBC 18.1(19.8); HGB 10; CREAT NL; cont PCN until directed otherwise by urology; other plans as per medicine service   Electronically Signed: D. Jeananne RamaKevin Allred 07/04/2015, 12:54 PM   I spent a total of 15 minutes at the the patient's bedside AND on the patient's hospital floor or unit, greater than 50% of which was counseling/coordinating care for right percutaneous nephrostomy

## 2015-07-04 NOTE — Progress Notes (Signed)
PROGRESS NOTE    Courtney [name]  ZOX:[id number] DOB: 1975-03-28 DOA: 06/28/2015 PCP: Nicki Reaper, NP   Brief Narrative:  Patient is a 40 year old who presented with Escherichia coli urosepsis secondary to 6 mm right lower ureteral stone. Patient initially was seen and had septic shock and required pressors. Critical care initially admitted patient and subsequently patient after improvement has been transitioned to our team.  Assessment & Plan:   Active Problems:   Septic shock (HCC) - Resolving - Per discussion with pharmacy will transition patient to ampicillin discontinue Rocephin - Urine culture reporting Escherichia coli - Blood culture reporting Escherichia coli. Patient will require 14 days of antibiotic therapy - d/c continuous pulse ox and cardiac monitoring - remove foley catheter.  - Leukocytosis still significant. We'll plan on continuing IV antibiotics until leukocytosis resolves or gets close to normal limits.  Escherichia coli bacteremia - For now we'll continue IV ampicillin with plans to transition to oral antibiotic regimen with continued improvement in condition and resolution of fevers and leukocytosis. - Patient will need 14 days total of antibiotics. Today being day 6 of 14  Ureteral stone - Urology managing. IR consulted and percuteneous nephrostomy tube placed. - Plan is to allow patient to heal for about 3 weeks at home before definitive stone surgery   DVT prophylaxis: Heparin Code Status: Full Family Communication: None at bedside Disposition Plan: Pending improvement in condition   Consultants:   Radiology  urology   Procedures: Percutaneous tube   Antimicrobials: Ampicillin   Subjective: Patient feels better today. Patient complaining of right thigh near groin pain.  Objective: Filed Vitals:   07/03/15 1433 07/03/15 2320 07/04/15 0530 07/04/15 1357  BP: 119/66 118/70 109/69 118/68  Pulse: 87 66 67 75  Temp: 98.9 F (37.2 C)  99 F (37.2 C) 98.9 F (37.2 C) 98.3 F (36.8 C)  TempSrc: Oral Oral Oral Oral  Resp: Height:      Weight:   67.9 kg (149 lb 11.1 oz)   SpO2: 97% 100% 96% 98%    Intake/Output Summary (Last 24 hours) at 07/04/15 1523 Last data filed at 07/04/15 1409  Gross per 24 hour  Intake    225 ml  Output   3075 ml  Net  -2850 ml   Filed Weights   06/30/15 0400 07/03/15 0545 07/04/15 0530  Weight: 67.7 kg (149 lb 4 oz) 70.2 kg (154 lb 12.2 oz) 67.9 kg (149 lb 11.1 oz)    Examination:  General exam: Appears calm and comfortable  Respiratory system: Clear to auscultation. Respiratory effort normal. Cardiovascular system: S1 & S2 heard, RRR. Gastrointestinal system: Abdomen is nondistended, soft and nontender. Central nervous system: Alert and oriented. No focal neurological deficits. Extremities: Symmetric 5 x 5 power.No palpable cords and negative Homans sign on right lower extremity Skin: No rashes, lesions or ulcers on limited exam,  Psychiatry: Judgement and insight appear normal. Mood & affect appropriate.     Data Reviewed: I have personally reviewed following labs and imaging studies  CBC:  Recent Labs Lab 06/28/15 1045 06/29/15 0350 06/30/15 0400 07/01/15 0309 07/02/15 0527 07/04/15 0512  WBC 14.0* 14.7* 17.2* 21.9* 19.8* 18.1*  NEUTROABS 13.1*  --   --   --   --   --   HGB 9.8* 9.5* 9.3* 9.6* 9.5* 10.0*  HCT 28.3* 27.2* 26.6* 27.8* 27.3* 29.3*  MCV 83.2 84.5 83.9 84.5 82.7 85.4  PLT 51* 47* 58* 67* 92* 190  Basic Metabolic Panel:  Recent Labs Lab 06/28/15 1410 06/29/15 0350 06/30/15 0400 07/01/15 0309 07/04/15 0512  NA 133* 137 140 140 140  K 4.6 3.7 3.4* 3.4* 3.6  CL 109 114* 117* 115* 112*  CO2 15* 16* 19* 20* 24  GLUCOSE 89 77 91 100* 88  BUN 49* 43* 29* 17 8  CREATININE 3.53* 2.44* 1.04* 0.66 0.47  CALCIUM 5.6* 6.9* 7.7* 7.7* 8.1*  MG 1.2* 1.5*  --   --   --   PHOS 3.9 3.7  --   --   --    GFR: Estimated Creatinine Clearance:  85.8 mL/min (by C-G formula based on Cr of 0.47). Liver Function Tests:  Recent Labs Lab 06/28/15 1045  AST 63*  ALT 32  ALKPHOS 72  BILITOT 1.1  PROT 5.5*  ALBUMIN 3.4*   No results for input(s): LIPASE, AMYLASE in the last 168 hours. No results for input(s): AMMONIA in the last 168 hours. Coagulation Profile:  Recent Labs Lab 06/28/15 1410  INR 1.75*   Cardiac Enzymes:  Recent Labs Lab 06/28/15 1350  CKTOTAL 1067*   BNP (last 3 results) No results for input(s): PROBNP in the last 8760 hours. HbA1C: No results for input(s): HGBA1C in the last 72 hours. CBG: No results for input(s): GLUCAP in the last 168 hours. Lipid Profile: No results for input(s): CHOL, HDL, LDLCALC, TRIG, CHOLHDL, LDLDIRECT in the last 72 hours. Thyroid Function Tests: No results for input(s): TSH, T4TOTAL, FREET4, T3FREE, THYROIDAB in the last 72 hours. Anemia Panel: No results for input(s): VITAMINB12, FOLATE, FERRITIN, TIBC, IRON, RETICCTPCT in the last 72 hours. Sepsis Labs:  Recent Labs Lab 06/28/15 1059 06/28/15 1329 06/28/15 1410 06/28/15 1600  PROCALCITON  --   --  81.95  --   LATICACIDVEN 3.41* 2.02*  --  2.6*    Recent Results (from the past 240 hour(s))  Blood Culture (routine x 2)     Status: Abnormal   Collection Time: 06/28/15 11:00 AM  Result Value Ref Range Status   Specimen Description BLOOD LEFT ARM  Final   Special Requests BOTTLES DRAWN AEROBIC AND ANAEROBIC 5 CC  Final   Culture  Setup Time   Final    GRAM NEGATIVE RODS IN BOTH AEROBIC AND ANAEROBIC BOTTLES CRITICAL RESULT CALLED TO, READ BACK BY AND VERIFIED WITH: C.SUMME [id number] @0806  M.MOHR    Culture (A)  Final    ESCHERICHIA COLI SUSCEPTIBILITIES PERFORMED ON PREVIOUS CULTURE WITHIN THE LAST 5 DAYS. Performed at [facility]    Report Status 07/01/2015 FINAL  Final  Blood Culture (routine x 2)     Status: Abnormal   Collection Time: 06/28/15 11:18 AM  Result Value Ref Range Status   Specimen  Description BLOOD LEFT HAND  Final   Special Requests BOTTLES DRAWN AEROBIC ONLY 5CC  Final   Culture  Setup Time   Final    GRAM NEGATIVE RODS AEROBIC BOTTLE ONLY Organism ID to follow CRITICAL RESULT CALLED TO, READ BACK BY AND VERIFIED WITH: C.SUMME [id number] @ 0806  M.MOHR Performed at [facility]    Culture ESCHERICHIA COLI (A)  Final   Report Status 07/01/2015 FINAL  Final   Organism ID, Bacteria ESCHERICHIA COLI  Final      Susceptibility   Escherichia coli - MIC*    AMPICILLIN <=2 SENSITIVE Sensitive     CEFAZOLIN <=4 SENSITIVE Sensitive     CEFEPIME <=1 SENSITIVE Sensitive     CEFTAZIDIME <=1 SENSITIVE Sensitive  CEFTRIAXONE <=1 SENSITIVE Sensitive     CIPROFLOXACIN <=0.25 SENSITIVE Sensitive     GENTAMICIN <=1 SENSITIVE Sensitive     IMIPENEM <=0.25 SENSITIVE Sensitive     TRIMETH/SULFA <=20 SENSITIVE Sensitive     AMPICILLIN/SULBACTAM <=2 SENSITIVE Sensitive     PIP/TAZO <=4 SENSITIVE Sensitive     * ESCHERICHIA COLI  Blood Culture ID Panel (Reflexed)     Status: Abnormal   Collection Time: 06/28/15 11:18 AM  Result Value Ref Range Status   Enterococcus species NOT DETECTED NOT DETECTED Final   Vancomycin resistance NOT DETECTED NOT DETECTED Final   Listeria monocytogenes NOT DETECTED NOT DETECTED Final   Staphylococcus species NOT DETECTED NOT DETECTED Final   Staphylococcus aureus NOT DETECTED NOT DETECTED Final   Methicillin resistance NOT DETECTED NOT DETECTED Final   Streptococcus species NOT DETECTED NOT DETECTED Final   Streptococcus agalactiae NOT DETECTED NOT DETECTED Final   Streptococcus pneumoniae NOT DETECTED NOT DETECTED Final   Streptococcus pyogenes NOT DETECTED NOT DETECTED Final   Acinetobacter baumannii NOT DETECTED NOT DETECTED Final   Enterobacteriaceae species DETECTED (A) NOT DETECTED Corrected    Comment: CRITICAL RESULT CALLED TO, READ BACK BY AND VERIFIED WITH: C.SUMME [id number] @0806  M.MOHR CORRECTED ON 06/21 AT 0812: PREVIOUSLY  REPORTED AS DETECTED C.SUMME [id number] @0806  M.MOHR    Enterobacter cloacae complex NOT DETECTED NOT DETECTED Corrected    Comment: CORRECTED ON 06/21 AT [id number]: PREVIOUSLY REPORTED AS DETECTED CRITICAL RESULT CALLED TO, READ BACK BY AND VERIFIED WITH: C.SUMME [id number] @0806  M.MOHR   Escherichia coli DETECTED (A) NOT DETECTED Corrected    Comment: CRITICAL RESULT CALLED TO, READ BACK BY AND VERIFIED WITH: C.SUMME [phone] M.MOHR CORRECTED ON 06/21 AT [id number]: PREVIOUSLY REPORTED AS NOT DETECTED    Klebsiella oxytoca NOT DETECTED NOT DETECTED Final   Klebsiella pneumoniae NOT DETECTED NOT DETECTED Final   Proteus species NOT DETECTED NOT DETECTED Final   Serratia marcescens NOT DETECTED NOT DETECTED Final   Carbapenem resistance NOT DETECTED NOT DETECTED Final   Haemophilus influenzae NOT DETECTED NOT DETECTED Final   Neisseria meningitidis NOT DETECTED NOT DETECTED Final   Pseudomonas aeruginosa NOT DETECTED NOT DETECTED Final   Candida albicans NOT DETECTED NOT DETECTED Final   Candida glabrata NOT DETECTED NOT DETECTED Final   Candida krusei NOT DETECTED NOT DETECTED Final   Candida parapsilosis NOT DETECTED NOT DETECTED Final   Candida tropicalis NOT DETECTED NOT DETECTED Final    Comment: [facility]  Urine culture     Status: Abnormal   Collection Time: 06/28/15  3:41 PM  Result Value Ref Range Status   Specimen Description URINE, CATHETERIZED  Final   Special Requests NONE  Final   Culture 20,000 COLONIES/mL ESCHERICHIA COLI (A)  Final   Report Status 06/30/2015 FINAL  Final   Organism ID, Bacteria ESCHERICHIA COLI (A)  Final      Susceptibility   Escherichia coli - MIC*    AMPICILLIN <=2 SENSITIVE Sensitive     CEFAZOLIN <=4 SENSITIVE Sensitive     CEFTRIAXONE <=1 SENSITIVE Sensitive     CIPROFLOXACIN <=0.25 SENSITIVE Sensitive     GENTAMICIN <=1 SENSITIVE Sensitive     IMIPENEM <=0.25 SENSITIVE Sensitive     NITROFURANTOIN <=16 SENSITIVE Sensitive      TRIMETH/SULFA <=20 SENSITIVE Sensitive     AMPICILLIN/SULBACTAM <=2 SENSITIVE Sensitive     PIP/TAZO <=4 SENSITIVE Sensitive     * 20,000 COLONIES/mL ESCHERICHIA COLI  MRSA PCR Screening  Status: None   Collection Time: 06/28/15  4:01 PM  Result Value Ref Range Status   MRSA by PCR NEGATIVE NEGATIVE Final    Comment:        The GeneXpert MRSA Assay (FDA approved for NASAL specimens only), is one component of a comprehensive MRSA colonization surveillance program. It is not intended to diagnose MRSA infection nor to guide or monitor treatment for MRSA infections.          Radiology Studies: No results found.      Scheduled Meds: . ampicillin (OMNIPEN) IV  1 g Intravenous Q6H  . heparin subcutaneous  5,000 Units Subcutaneous Q8H   Continuous Infusions:    LOS: 6 days    Time spent: > 35 minutes    Penny PiaVEGA, Morgaine Kimball, MD [facility] Pager [phone]  If 7PM-7AM, please contact night-coverage [email or web address] Password [email or web address] 07/04/2015, 3:23 PM

## 2015-07-05 LAB — CBC
HEMATOCRIT: 30.2 % — AB (ref 36.0–46.0)
Hemoglobin: 10.2 g/dL — ABNORMAL LOW (ref 12.0–15.0)
MCH: 29.1 pg (ref 26.0–34.0)
MCHC: 33.8 g/dL (ref 30.0–36.0)
MCV: 86 fL (ref 78.0–100.0)
Platelets: 252 10*3/uL (ref 150–400)
RBC: 3.51 MIL/uL — AB (ref 3.87–5.11)
RDW: 14 % (ref 11.5–15.5)
WBC: 15.2 10*3/uL — AB (ref 4.0–10.5)

## 2015-07-05 NOTE — Progress Notes (Signed)
PROGRESS NOTE    Courtney [name]  ZOX:[id number] DOB: [birth year] DOA: 06/28/2015 PCP: Nicki ReaperBAITY, REGINA, NP   Brief Narrative:  Patient is a 40 year old who presented with Escherichia coli urosepsis secondary to 6 mm right lower ureteral stone. Patient initially was seen and had septic shock and required pressors. Critical care initially admitted patient and subsequently patient after improvement has been transitioned to our team.  Assessment & Plan:   Active Problems:   Septic shock (HCC) - Resolving - Per discussion with pharmacy will transition patient to ampicillin discontinue Rocephin - Urine culture reporting Escherichia coli - Blood culture reporting Escherichia coli. Patient will require 14 days of antibiotic therapy - d/c continuous pulse ox and cardiac monitoring - remove foley catheter.  - Leukocytosis still significant. We'll plan on continuing IV antibiotics until leukocytosis resolves or gets close to normal limits.  Escherichia coli bacteremia - For now we'll continue IV ampicillin with plans to transition to oral antibiotic regimen with continued improvement in condition and resolution of fevers and leukocytosis. - Patient will need 14 days total of antibiotics. Today being day 7 of 14  Ureteral stone - Urology managing. IR consulted and percuteneous nephrostomy tube placed. - Plan is to allow patient to heal for about 3 weeks at home before definitive stone surgery   DVT prophylaxis: Heparin Code Status: Full Family Communication: None at bedside Disposition Plan: d/c with continued improvement in the next 1-2 days   Consultants:   Radiology  urology   Procedures: Percutaneous tube   Antimicrobials: Ampicillin   Subjective: Patient feels better today. No new complaints  Objective: Filed Vitals:   07/04/15 2228 07/05/15 0300 07/05/15 0530 07/05/15 1345  BP: 113/71  119/73 112/63  Pulse: 70  59 83  Temp: 98.7 F (37.1 C)  98.7 F (37.1 C) 99.1  F (37.3 C)  TempSrc: Oral  Oral Oral  Resp: 20  20 20   Height:      Weight:  65.9 kg (145 lb 4.5 oz)    SpO2: 99%  97% 100%    Intake/Output Summary (Last 24 hours) at 07/05/15 1625 Last data filed at 07/05/15 1435  Gross per 24 hour  Intake    885 ml  Output   2610 ml  Net  -1725 ml   Filed Weights   07/03/15 0545 07/04/15 0530 07/05/15 0300  Weight: 70.2 kg (154 lb 12.2 oz) 67.9 kg (149 lb 11.1 oz) 65.9 kg (145 lb 4.5 oz)    Examination:  General exam: Appears calm and comfortable  Respiratory system: Clear to auscultation. Respiratory effort normal. Cardiovascular system: S1 & S2 heard, RRR. Gastrointestinal system: Abdomen is nondistended, soft and nontender. Central nervous system: Alert and oriented. No focal neurological deficits. Extremities: Symmetric 5 x 5 power.No palpable cords and negative Homans sign on right lower extremity Skin: No rashes, lesions or ulcers on limited exam,  Psychiatry: Judgement and insight appear normal. Mood & affect appropriate.     Data Reviewed: I have personally reviewed following labs and imaging studies  CBC:  Recent Labs Lab 06/30/15 0400 07/01/15 0309 07/02/15 0527 07/04/15 0512 07/05/15 0540  WBC 17.2* 21.9* 19.8* 18.1* 15.2*  HGB 9.3* 9.6* 9.5* 10.0* 10.2*  HCT 26.6* 27.8* 27.3* 29.3* 30.2*  MCV 83.9 84.5 82.7 85.4 86.0  PLT 58* 67* 92* 190 252   Basic Metabolic Panel:  Recent Labs Lab 06/29/15 0350 06/30/15 0400 07/01/15 0309 07/04/15 0512  NA 137 140 140 140  K 3.7 3.4* 3.4* 3.6  CL 114* 117* 115* 112*  CO2 16* 19* 20* 24  GLUCOSE 77 91 100* 88  BUN 43* 29* 17 8  CREATININE 2.44* 1.04* 0.66 0.47  CALCIUM 6.9* 7.7* 7.7* 8.1*  MG 1.5*  --   --   --   PHOS 3.7  --   --   --    GFR: Estimated Creatinine Clearance: 85.8 mL/min (by C-G formula based on Cr of 0.47). Liver Function Tests: No results for input(s): AST, ALT, ALKPHOS, BILITOT, PROT, ALBUMIN in the last 168 hours. No results for input(s):  LIPASE, AMYLASE in the last 168 hours. No results for input(s): AMMONIA in the last 168 hours. Coagulation Profile: No results for input(s): INR, PROTIME in the last 168 hours. Cardiac Enzymes: No results for input(s): CKTOTAL, CKMB, CKMBINDEX, TROPONINI in the last 168 hours. BNP (last 3 results) No results for input(s): PROBNP in the last 8760 hours. HbA1C: No results for input(s): HGBA1C in the last 72 hours. CBG: No results for input(s): GLUCAP in the last 168 hours. Lipid Profile: No results for input(s): CHOL, HDL, LDLCALC, TRIG, CHOLHDL, LDLDIRECT in the last 72 hours. Thyroid Function Tests: No results for input(s): TSH, T4TOTAL, FREET4, T3FREE, THYROIDAB in the last 72 hours. Anemia Panel: No results for input(s): VITAMINB12, FOLATE, FERRITIN, TIBC, IRON, RETICCTPCT in the last 72 hours. Sepsis Labs: No results for input(s): PROCALCITON, LATICACIDVEN in the last 168 hours.  Recent Results (from the past 240 hour(s))  Blood Culture (routine x 2)     Status: Abnormal   Collection Time: 06/28/15 11:00 AM  Result Value Ref Range Status   Specimen Description BLOOD LEFT ARM  Final   Special Requests BOTTLES DRAWN AEROBIC AND ANAEROBIC 5 CC  Final   Culture  Setup Time   Final    GRAM NEGATIVE RODS IN BOTH AEROBIC AND ANAEROBIC BOTTLES CRITICAL RESULT CALLED TO, READ BACK BY AND VERIFIED WITH: C.SUMME [id number]  M.MOHR    Culture (A)  Final    ESCHERICHIA COLI SUSCEPTIBILITIES PERFORMED ON PREVIOUS CULTURE WITHIN THE LAST 5 DAYS. Performed at [facility]    Report Status 07/01/2015 FINAL  Final  Blood Culture (routine x 2)     Status: Abnormal   Collection Time: 06/28/15 11:18 AM  Result Value Ref Range Status   Specimen Description BLOOD LEFT HAND  Final   Special Requests BOTTLES DRAWN AEROBIC ONLY 5CC  Final   Culture  Setup Time   Final    GRAM NEGATIVE RODS AEROBIC BOTTLE ONLY Organism ID to follow CRITICAL RESULT CALLED TO, READ BACK BY AND VERIFIED  WITH: C.SUMME [id number] @ 0806  M.MOHR Performed at [facility]    Culture ESCHERICHIA COLI (A)  Final   Report Status 07/01/2015 FINAL  Final   Organism ID, Bacteria ESCHERICHIA COLI  Final      Susceptibility   Escherichia coli - MIC*    AMPICILLIN <=2 SENSITIVE Sensitive     CEFAZOLIN <=4 SENSITIVE Sensitive     CEFEPIME <=1 SENSITIVE Sensitive     CEFTAZIDIME <=1 SENSITIVE Sensitive     CEFTRIAXONE <=1 SENSITIVE Sensitive     CIPROFLOXACIN <=0.25 SENSITIVE Sensitive     GENTAMICIN <=1 SENSITIVE Sensitive     IMIPENEM <=0.25 SENSITIVE Sensitive     TRIMETH/SULFA <=20 SENSITIVE Sensitive     AMPICILLIN/SULBACTAM <=2 SENSITIVE Sensitive     PIP/TAZO <=4 SENSITIVE Sensitive     * ESCHERICHIA COLI  Blood Culture ID Panel (Reflexed)  Status: Abnormal   Collection Time: 06/28/15 11:18 AM  Result Value Ref Range Status   Enterococcus species NOT DETECTED NOT DETECTED Final   Vancomycin resistance NOT DETECTED NOT DETECTED Final   Listeria monocytogenes NOT DETECTED NOT DETECTED Final   Staphylococcus species NOT DETECTED NOT DETECTED Final   Staphylococcus aureus NOT DETECTED NOT DETECTED Final   Methicillin resistance NOT DETECTED NOT DETECTED Final   Streptococcus species NOT DETECTED NOT DETECTED Final   Streptococcus agalactiae NOT DETECTED NOT DETECTED Final   Streptococcus pneumoniae NOT DETECTED NOT DETECTED Final   Streptococcus pyogenes NOT DETECTED NOT DETECTED Final   Acinetobacter baumannii NOT DETECTED NOT DETECTED Final   Enterobacteriaceae species DETECTED (A) NOT DETECTED Corrected    Comment: CRITICAL RESULT CALLED TO, READ BACK BY AND VERIFIED WITH: C.SUMME [id number] @0806  M.MOHR CORRECTED ON 06/21 AT 0812: PREVIOUSLY REPORTED AS DETECTED C.SUMME [id number] @0806  M.MOHR    Enterobacter cloacae complex NOT DETECTED NOT DETECTED Corrected    Comment: CORRECTED ON 06/21 AT 8119: PREVIOUSLY REPORTED AS DETECTED CRITICAL RESULT CALLED TO, READ BACK BY AND VERIFIED  WITH: C.SUMME [id number] @0806  M.MOHR   Escherichia coli DETECTED (A) NOT DETECTED Corrected    Comment: CRITICAL RESULT CALLED TO, READ BACK BY AND VERIFIED WITH: C.SUMME [id number] 0806 M.MOHR CORRECTED ON 06/21 AT 8657: PREVIOUSLY REPORTED AS NOT DETECTED    Klebsiella oxytoca NOT DETECTED NOT DETECTED Final   Klebsiella pneumoniae NOT DETECTED NOT DETECTED Final   Proteus species NOT DETECTED NOT DETECTED Final   Serratia marcescens NOT DETECTED NOT DETECTED Final   Carbapenem resistance NOT DETECTED NOT DETECTED Final   Haemophilus influenzae NOT DETECTED NOT DETECTED Final   Neisseria meningitidis NOT DETECTED NOT DETECTED Final   Pseudomonas aeruginosa NOT DETECTED NOT DETECTED Final   Candida albicans NOT DETECTED NOT DETECTED Final   Candida glabrata NOT DETECTED NOT DETECTED Final   Candida krusei NOT DETECTED NOT DETECTED Final   Candida parapsilosis NOT DETECTED NOT DETECTED Final   Candida tropicalis NOT DETECTED NOT DETECTED Final    Comment: Performed at [facility]  Urine culture     Status: Abnormal   Collection Time: 06/28/15  3:41 PM  Result Value Ref Range Status   Specimen Description URINE, CATHETERIZED  Final   Special Requests NONE  Final   Culture 20,000 COLONIES/mL ESCHERICHIA COLI (A)  Final   Report Status 06/30/2015 FINAL  Final   Organism ID, Bacteria ESCHERICHIA COLI (A)  Final      Susceptibility   Escherichia coli - MIC*    AMPICILLIN <=2 SENSITIVE Sensitive     CEFAZOLIN <=4 SENSITIVE Sensitive     CEFTRIAXONE <=1 SENSITIVE Sensitive     CIPROFLOXACIN <=0.25 SENSITIVE Sensitive     GENTAMICIN <=1 SENSITIVE Sensitive     IMIPENEM <=0.25 SENSITIVE Sensitive     NITROFURANTOIN <=16 SENSITIVE Sensitive     TRIMETH/SULFA <=20 SENSITIVE Sensitive     AMPICILLIN/SULBACTAM <=2 SENSITIVE Sensitive     PIP/TAZO <=4 SENSITIVE Sensitive     * 20,000 COLONIES/mL ESCHERICHIA COLI  MRSA PCR Screening     Status: None   Collection Time: 06/28/15  4:01 PM    Result Value Ref Range Status   MRSA by PCR NEGATIVE NEGATIVE Final    Comment:        The GeneXpert MRSA Assay (FDA approved for NASAL specimens only), is one component of a comprehensive MRSA colonization surveillance program. It is not intended to diagnose MRSA infection nor to guide or  monitor treatment for MRSA infections.          Radiology Studies: No results found.      Scheduled Meds: . ampicillin (OMNIPEN) IV  1 g Intravenous Q6H  . heparin subcutaneous  5,000 Units Subcutaneous Q8H   Continuous Infusions:    LOS: 7 days    Time spent: > 35 minutes    Penny PiaVEGA, Sareen Randon, MD [facility] Pager [phone]  If 7PM-7AM, please contact night-coverage [email or web address] Password [email or web address] 07/05/2015, 4:25 PM

## 2015-07-05 NOTE — Care Management Note (Signed)
Case Management Note  Patient Details  Name: Elvera LennoxMargarita E Arney MRN: [id number] Date of Birth: [birth year]  Subjective/Objective:  40 yo admitted with septic shock                  Action/Plan: From home with spouse.  Chart reviewed and CM following for DC needs.  Expected Discharge Date:   (UNKNOWN)               Expected Discharge Plan:  Home/Self Care  In-House Referral:     Discharge planning Services  CM Consult  Post Acute Care Choice:    Choice offered to:     DME Arranged:    DME Agency:     HH Arranged:    HH Agency:     Status of Service:  In process, will continue to follow  If discussed at Long Length of Stay Meetings, dates discussed:    Additional Comments:  Bartholome BillCLEMENTS, Nour Scalise H, RN 07/05/2015, 1:10 PM  [phone]

## 2015-07-06 DIAGNOSIS — N10 Acute tubulo-interstitial nephritis: Secondary | ICD-10-CM | POA: Diagnosis present

## 2015-07-06 DIAGNOSIS — A4151 Sepsis due to Escherichia coli [E. coli]: Secondary | ICD-10-CM | POA: Diagnosis present

## 2015-07-06 DIAGNOSIS — N135 Crossing vessel and stricture of ureter without hydronephrosis: Secondary | ICD-10-CM

## 2015-07-06 DIAGNOSIS — N200 Calculus of kidney: Secondary | ICD-10-CM

## 2015-07-06 DIAGNOSIS — N390 Urinary tract infection, site not specified: Secondary | ICD-10-CM

## 2015-07-06 DIAGNOSIS — R7881 Bacteremia: Secondary | ICD-10-CM | POA: Diagnosis present

## 2015-07-06 DIAGNOSIS — B9620 Unspecified Escherichia coli [E. coli] as the cause of diseases classified elsewhere: Secondary | ICD-10-CM | POA: Diagnosis present

## 2015-07-06 DIAGNOSIS — N201 Calculus of ureter: Secondary | ICD-10-CM | POA: Diagnosis present

## 2015-07-06 DIAGNOSIS — N139 Obstructive and reflux uropathy, unspecified: Secondary | ICD-10-CM

## 2015-07-06 MED ORDER — CIPROFLOXACIN HCL 500 MG PO TABS: 500.0000 mg | ORAL_TABLET | Freq: Two times a day (BID) | ORAL | Status: AC

## 2015-07-06 MED ORDER — TAMSULOSIN HCL 0.4 MG PO CAPS: 0.4000 mg | ORAL_CAPSULE | Freq: Every day | ORAL | Status: DC

## 2015-07-06 NOTE — Progress Notes (Signed)
Pt discharged home with spouse in stable condition. Discharge instructions and scripts given. Pt and spouse verbalized understanding. Strainer given to patient as well

## 2015-07-06 NOTE — Evaluation (Signed)
Physical Therapy One Time Evaluation Patient Details Name: Courtney LennoxMargarita E [name] MRN: [id number] DOB: Aug 23, 1975 Today's Date: 07/06/2015   History of Present Illness  40 year old female who presented with septic shock secondary to Escherichia coli UTI/pyelonephritis with bacteremia and 6 mm obstructing right distal ureteral stone with moderate to severe right obstructive uropathy, s/p percuteneous nephrostomy tube placed  Clinical Impression  Patient evaluated by Physical Therapy with no further acute PT needs identified. All education has been completed and the patient has no further questions.  See below for any follow-up Physical Therapy or equipment needs. PT is signing off. Thank you for this referral.     Follow Up Recommendations No PT follow up    Equipment Recommendations  None recommended by PT    Recommendations for Other Services       Precautions / Restrictions Precautions Precaution Comments: PCN drain      Mobility  Bed Mobility Overal bed mobility: Modified Independent                Transfers Overall transfer level: Modified independent                  Ambulation/Gait Ambulation/Gait assistance: Supervision Ambulation Distance (Feet): 240 Feet Assistive device: None Gait Pattern/deviations: Antalgic;Step-through pattern     General Gait Details: pt reports some pain in R thigh therefore antalgic gait however denied need for RW, steady and no LOB observed  Stairs Stairs: Yes Stairs assistance: Min guard Stair Management: Step to pattern;Forwards;One rail Left Number of Stairs: 10 General stair comments: pt attempting step through pattern however R LE very shaky with WBing so educated on step to gait pattern for safety upon d/c home  Wheelchair Mobility    Modified Rankin (Stroke Patients Only)       Balance                                             Pertinent Vitals/Pain Pain Assessment: 0-10 Pain Score: 5   Pain Location: R upper inner thigh and posterior upper thigh Pain Descriptors / Indicators: Sore Pain Intervention(s): Limited activity within patient's tolerance;Monitored during session  Pt reports this pain started after admission.  Pt denies increased swelling in this area.  Recommended pt contact MD if symptoms persist or worsen.    Home Living Family/patient expects to be discharged to:: Private residence Living Arrangements: Spouse/significant other   Type of Home: Apartment Home Access: Stairs to enter Entrance Stairs-Rails: Left Entrance Stairs-Number of Steps: flight   Home Equipment: None      Prior Function Level of Independence: Independent               Hand Dominance        Extremity/Trunk Assessment               Lower Extremity Assessment: Overall WFL for tasks assessed         Communication   Communication: No difficulties  Cognition Arousal/Alertness: Awake/alert Behavior During Therapy: WFL for tasks assessed/performed Overall Cognitive Status: Within Functional Limits for tasks assessed                      General Comments      Exercises        Assessment/Plan    PT Assessment Patent does not need any further PT services  PT Diagnosis Difficulty walking  PT Problem List    PT Treatment Interventions     PT Goals (Current goals can be found in the Care Plan section) Acute Rehab PT Goals PT Goal Formulation: All assessment and education complete, DC therapy    Frequency     Barriers to discharge        Co-evaluation               End of Session   Activity Tolerance: Patient tolerated treatment well Patient left: in bed;with call bell/phone within reach;with family/visitor present Nurse Communication: Mobility status         Time: 1413-1420 PT Time Calculation (min) (ACUTE ONLY): 7 min   Charges:   PT Evaluation $PT Eval Low Complexity: 1 Procedure     PT G Codes:         Lisa-Marie Rueger,KATHrine E 07/06/2015, 3:48 PM Zenovia JarredKati Johnesha Acheampong, PT, DPT 07/06/2015 Pager: [phone]

## 2015-07-06 NOTE — Progress Notes (Signed)
Patient ID: Courtney [name], female   DOB: [birth year], 40 y.o.   MRN: [id number]    Referring Physician(s): Tannenbaum,S  Supervising Physician: Simonne ComeWatts, John  Patient Status:  Inpatient  Chief Complaint: Urosepsis/obstructive right ureteral stone   Subjective: Pt doing very well; has been ambulating in hallway; denies sig flank pain,N/V   Allergies: Review of patient's allergies indicates no known allergies.  Medications: Prior to Admission medications   Medication Sig Start Date End Date Taking? Authorizing Provider  ibuprofen (ADVIL,MOTRIN) 600 MG tablet Take 1 tablet (600 mg total) by mouth every 6 (six) hours as needed. Patient taking differently: Take 600 mg by mouth every 6 (six) hours as needed for moderate pain.  06/26/15  Yes Tatyana Kirichenko, PA-C  ondansetron (ZOFRAN ODT) 8 MG disintegrating tablet Take 1 tablet (8 mg total) by mouth every 8 (eight) hours as needed for nausea or vomiting. 06/26/15  Yes Tatyana Kirichenko, PA-C  oxyCODONE-acetaminophen (PERCOCET) 5-325 MG tablet Take 1-2 tablets by mouth every 4 (four) hours as needed for severe pain. 06/26/15  Yes Tatyana Kirichenko, PA-C  tamsulosin (FLOMAX) 0.4 MG CAPS capsule Take 1 capsule (0.4 mg total) by mouth daily. 06/26/15  Yes Tatyana Kirichenko, PA-C     Vital Signs: BP 105/59 mmHg  Pulse 63  Temp(Src) 98.9 F (37.2 C) (Oral)  Resp 20  Ht 5\' 5"  (1.651 m)  Wt 141 lb 8.6 oz (64.2 kg)  BMI 23.55 kg/m2  SpO2 98%  LMP 06/28/2015  Physical Exam right PCN intact, output 625 cc yellow urine; insertion site ok, not sig tender  Imaging: No results found.  Labs:  CBC:  Recent Labs  07/01/15 0309 07/02/15 0527 07/04/15 0512 07/05/15 0540  WBC 21.9* 19.8* 18.1* 15.2*  HGB 9.6* 9.5* 10.0* 10.2*  HCT 27.8* 27.3* 29.3* 30.2*  PLT 67* 92* 190 252    COAGS:  Recent Labs  06/28/15 1410  INR 1.75*  APTT 40*    BMP:  Recent Labs  06/29/15 0350 06/30/15 0400 07/01/15 0309 07/04/15 0512    NA 137 140 140 140  K 3.7 3.4* 3.4* 3.6  CL 114* 117* 115* 112*  CO2 16* 19* 20* 24  GLUCOSE 77 91 100* 88  BUN 43* 29* 17 8  CALCIUM 6.9* 7.7* 7.7* 8.1*  CREATININE 2.44* 1.04* 0.66 0.47  GFRNONAA 24* >60 >60 >60  GFRAA 28* >60 >60 >60    LIVER FUNCTION TESTS:  Recent Labs  06/26/15 1420 06/28/15 1045  BILITOT 1.6* 1.1  AST 19 63*  ALT 12* 32  ALKPHOS 44 72  PROT 6.7 5.5*  ALBUMIN 4.3 3.4*    Assessment and Plan: S/p right PCN 6/20 for obstructive rt ureteral stone/hydro/sepsis; cx's - e coli; AF; no new labs; cont PCN as per urology recommendations; as OP pt should record output of nephrostomy and change dressing every 1-2 days- will need to contact Dr. Imelda Pillowannenbaum's office for f/u date or call IR at [phone] with any nephrostomy related questions.    Electronically Signed: D. Jeananne RamaKevin Shauna Bodkins 07/06/2015, 11:23 AM   I spent a total of 15 minutes at the the patient's bedside AND on the patient's hospital floor or unit, greater than 50% of which was counseling/coordinating care for right perc nephrostomy

## 2015-07-06 NOTE — Discharge Summary (Signed)
Physician Discharge Summary  ALEECE LOYD ZOX:[id number] DOB: 02-19-1975 DOA: 06/28/2015  PCP: Nicki Reaper, NP  Admit date: 06/28/2015 Discharge date: 07/06/2015  Admitted From: home  Disposition:  Home with [facility]  Recommendations for Outpatient Follow-up:  1. Follow up with PCP in 1-2 weeks. Completes total 2 weeks of antibiotics on 07/13/2015. 2. Follow-up with urology Dr. Patsi Sears in 2 weeks.   Home Health: RN Equipment/Devices: None (patient being discharged with right percutaneous nephrostomy tube and urinary strainer)  Discharge Condition: Stable CODE STATUS: Full code Diet recommendation: Regular   Discharge Diagnoses:  Principal Problem:   Septic shock (HCC)   Active Problems:   Sepsis due to Escherichia coli (HCC)   Bacteremia due to Escherichia coli   E-coli UTI   Right ureteral stone   Right nephrolithiasis   Acute pyelonephritis   Acute unilateral obstructive uropathy   Hypokalemia  Brief narrative/history of present illness Please refer to admission H&P for details, in brief, 40 year old female who presented with septic shock secondary to Escherichia coli UTI/pyelonephritis with bacteremia and 6 mm obstructing right distal ureteral stone with moderate to severe right obstructive uropathy. Patient admitted to ICU requiring pressors and empiric antibiotics. Patient seen by urology and underwent percutaneous nephrostomy tube placement by interventional radiology. She was then transferred to hospitalist service.  Hospital course Septic shock (HCC) Secondary to Escherichia coli UTI/pyelonephritis with bacteremia. Culture pansensitive and sepsis resolved with empiric ampicillin. Clinically improved. I will discharge her on oral ciprofloxacin for 1 more week to complete total 2 weeks of antibiotics.  Obstructing right distal ureteral stone Percutaneous nephrostomy tube placed by IR. Urology recommends to continue PCN for about 3 weeks to let it heal and have  patient follow-up in the office for definitive stone surgery. She will be discharged on urinary strainer and if she is able to pass stone she will not need surgery. Prescribed Flomax. Nephrostomy tube is functioning well and patient does not have any pain symptoms. We will arrange home health RN.  Acute kidney injury Secondary to obstructive uropathy and sepsis. Resolved with hydration and percutaneous nephrostomy tube placement.  Hypokalemia Replenished  Patient stable to be discharged home with outpatient PCP and urology follow-up.  Consults: PC CM Urology (Dr. Patsi Sears) IR   Discharge Instructions     Medication List    STOP taking these medications        oxyCODONE-acetaminophen 5-325 MG tablet  Commonly known as:  PERCOCET      TAKE these medications        ciprofloxacin 500 MG tablet  Commonly known as:  CIPRO  Take 1 tablet (500 mg total) by mouth 2 (two) times daily.     ibuprofen 600 MG tablet  Commonly known as:  ADVIL,MOTRIN  Take 1 tablet (600 mg total) by mouth every 6 (six) hours as needed.     ondansetron 8 MG disintegrating tablet  Commonly known as:  ZOFRAN ODT  Take 1 tablet (8 mg total) by mouth every 8 (eight) hours as needed for nausea or vomiting.     tamsulosin 0.4 MG Caps capsule  Commonly known as:  FLOMAX  Take 1 capsule (0.4 mg total) by mouth daily.           Follow-up Information    Follow up with Nicki Reaper, NP. Schedule an appointment as soon as possible for a visit in 1 week.   Specialty:  Internal Medicine   Contact information:   [address] [place] [address]  [phone]       Follow up with Kathi Ludwig, MD. Schedule an appointment as soon as possible for a visit in 2 weeks.   Specialty:  Urology   Contact information:   [address] [place] [address] [phone]      No Known Allergies    Procedures/Studies: Ct Abdomen Pelvis W Contrast  06/26/2015  CLINICAL DATA:   Right-sided flank pain.  Vomiting. EXAM: CT ABDOMEN AND PELVIS WITH CONTRAST TECHNIQUE: Multidetector CT imaging of the abdomen and pelvis was performed using the standard protocol following bolus administration of intravenous contrast. CONTRAST:  ISOVUE-300 IOPAMIDOL (ISOVUE-300) INJECTION 61% COMPARISON:  Abdominal ultrasound 10/20/2004 FINDINGS: Lower chest:  No acute findings. Hepatobiliary: No masses or other significant abnormality. Pancreas: No mass, inflammatory changes, or other significant abnormality. Spleen: Within normal limits in size and appearance. Adrenals/Urinary Tract: There is an obstructing 6 mm distal right ureteral stone slightly proximal to the vesicoureteral junction. There is a marked upstream right ureteral dilation with moderate right hydronephrosis. Right perirenal inflammatory fat stranding and small amount of free fluid are seen in the perirenal space. Other nonobstructing right renal calculi are seen in the lower pole of the right kidney measuring up to 4 mm. There is a tiny 1-2 mm nonobstructing calculus within the superior pole of the left kidney. The left kidney is otherwise normal. The urinary bladder is poorly visualized as it is decompressed. Stomach/Bowel: No evidence of obstruction, inflammatory process, or abnormal fluid collections. Vascular/Lymphatic: No pathologically enlarged lymph nodes. No evidence of abdominal aortic aneurysm. Reproductive: No mass or other significant abnormality. Other: None. Musculoskeletal:  No suspicious bone lesions identified. IMPRESSION: Obstructing 6 mm distal right ureteral stone with evidence of moderate to severe right obstructive uropathy. Additional bilateral nonobstructing renal calculi. Electronically Signed   By: Ted Mcalpine M.D.   On: 06/26/2015 16:28   Dg Chest Port 1 View  06/28/2015  CLINICAL DATA:  Urosepsis, central line placement EXAM: PORTABLE CHEST 1 VIEW COMPARISON:  06/28/2015 FINDINGS: Cardiomediastinal  silhouette is stable. Central mild vascular congestion again noted. There is right IJ central line with tip in SVC. No pneumothorax. No segmental infiltrate. IMPRESSION: Central vascular congestion again noted. Right IJ central line in place. No pneumothorax. Electronically Signed   By: Natasha Mead M.D.   On: 06/28/2015 14:18   Dg Chest Port 1 View  06/28/2015  CLINICAL DATA:  Hypotension.  Weakness and fatigue. EXAM: PORTABLE CHEST 1 VIEW COMPARISON:  06/26/2015 CT scan FINDINGS: Mild enlargement of the cardiopericardial silhouette with indistinct upper zone pulmonary vasculature. Lungs appear otherwise clear. No pleural effusion.  No significant bony abnormality observed. IMPRESSION: 1. Mild enlargement of the cardiopericardial silhouette with suspicion for pulmonary venous hypertension. No overt edema. Electronically Signed   By: Gaylyn Rong M.D.   On: 06/28/2015 12:56   Ir Nephrostomy Placement Right  06/28/2015  INDICATION: 40 year old female with a history of urosepsis an obstructive right ureteral stone. EXAM: IR NEPHROSTOMY PLACEMENT RIGHT COMPARISON:  None. MEDICATIONS: No additional medications; The antibiotic was administered in an appropriate time frame prior to skin puncture. ANESTHESIA/SEDATION: Fentanyl 50 mcg IV; Versed 0 mg IV The patient was continuously monitored during the procedure by the interventional radiology nurse under my direct supervision. CONTRAST:  10mL ISOVUE-300 IOPAMIDOL (ISOVUE-300) INJECTION 61% - administered into the collecting system(s) FLUOROSCOPY TIME:  Fluoroscopy Time: 1 minutes 24 seconds COMPLICATIONS: None PROCEDURE: Informed written consent was obtained from the patient and the patient's husband after a thorough  discussion of the procedural risks, benefits and alternatives. All questions were addressed. Maximal Sterile Barrier Technique was utilized including caps, mask, sterile gowns, sterile gloves, sterile drape, hand hygiene and skin antiseptic. A  timeout was performed prior to the initiation of the procedure. Patient positioned prone position on the fluoroscopy table. Ultrasound survey of the right flank was performed with images stored and sent to PACs. The patient was then prepped and draped in the usual sterile fashion. 1% lidocaine was used to anesthetize the skin and subcutaneous tissues for local anesthesia. A Chiba needle was then used to access a posterior inferior calyx with ultrasound guidance. With spontaneous urine returned through the needle, passage of an 018 micro wire into the collecting system was performed under fluoroscopy. A small incision was made with an 11 blade scalpel, and the needle was removed from the wire. An Accustick system was then advanced over the wire into the collecting system under fluoroscopy. The metal stiffener and inner dilator were removed, and then a sample of fluid was aspirated through the 4 French outer sheath. Bentson wire was passed into the collecting system and the sheath removed. Ten French dilation of the soft tissues was performed. Using modified Seldinger technique, a 10 French pigtail catheter drain was placed over the Bentson wire. Wire and inner stiffener removed, and the pigtail was formed in the collecting system. Small amount of contrast confirmed position of the catheter. Sample was aspirated for culture. Patient tolerated the procedure well and remained hemodynamically stable throughout. No complications were encountered and no significant blood loss encountered IMPRESSION: Status post image guided right percutaneous nephrostomy placement with a 10 French drain. Sample sent to the lab for analysis. Signed, Yvone Neu. Loreta Ave, DO [facility] Radiology Electronically Signed   By: Gilmer Mor D.O.   On: 06/28/2015 15:56    (Echo, Carotid, EGD, Colonoscopy, ERCP)    Subjective:   Discharge Exam: Filed Vitals:   07/05/15 2008 07/06/15 0601   BP: 114/60 105/59  Pulse: 95 63  Temp: 98.9 F (37.2 C) 98.9 F (37.2 C)  Resp: 20 20   Filed Vitals:   07/05/15 1345 07/05/15 2008 07/06/15 0514 07/06/15 0601  BP: 112/63 114/60  105/59  Pulse: 83 95  63  Temp: 99.1 F (37.3 C) 98.9 F (37.2 C)  98.9 F (37.2 C)  TempSrc: Oral Oral  Oral  Resp: 20 20  20   Height:   5\' 5"  (1.651 m)   Weight:   64.2 kg (141 lb 8.6 oz)   SpO2: 100% 96%  98%    General: Middle aged female not in distress HEENT: No pallor, moist mucosa, supple neck Cardiovascular: Normal S1 and S2, no murmurs Respiratory: CTA bilaterally, no wheezing, no rhonchi Abdominal: Soft, NT, ND, bowel sounds +, right percutaneous nephrostomy tube with clear urine Extremities: no edema, no cyanosis CNS: Alert and oriented    The results of significant diagnostics from this hospitalization (including imaging, microbiology, ancillary and laboratory) are listed below for reference.     Microbiology: Recent Results (from the past 240 hour(s))  Blood Culture (routine x 2)     Status: Abnormal   Collection Time: 06/28/15 11:00 AM  Result Value Ref Range Status   Specimen Description BLOOD LEFT ARM  Final   Special Requests BOTTLES DRAWN AEROBIC AND ANAEROBIC 5 CC  Final   Culture  Setup Time   Final    GRAM NEGATIVE RODS IN BOTH AEROBIC AND ANAEROBIC BOTTLES CRITICAL RESULT  CALLED TO, READ BACK BY AND VERIFIED WITH: C.SUMME [id number] @0806  M.MOHR    Culture (A)  Final    ESCHERICHIA COLI SUSCEPTIBILITIES PERFORMED ON PREVIOUS CULTURE WITHIN THE LAST 5 DAYS. Performed at [facility]    Report Status 07/01/2015 FINAL  Final  Blood Culture (routine x 2)     Status: Abnormal   Collection Time: 06/28/15 11:18 AM  Result Value Ref Range Status   Specimen Description BLOOD LEFT HAND  Final   Special Requests BOTTLES DRAWN AEROBIC ONLY 5CC  Final   Culture  Setup Time   Final    GRAM NEGATIVE RODS AEROBIC BOTTLE ONLY Organism ID to follow CRITICAL RESULT  CALLED TO, READ BACK BY AND VERIFIED WITH: C.SUMME [id number] @ 0806  M.MOHR Performed at [facility]    Culture ESCHERICHIA COLI (A)  Final   Report Status 07/01/2015 FINAL  Final   Organism ID, Bacteria ESCHERICHIA COLI  Final      Susceptibility   Escherichia coli - MIC*    AMPICILLIN <=2 SENSITIVE Sensitive     CEFAZOLIN <=4 SENSITIVE Sensitive     CEFEPIME <=1 SENSITIVE Sensitive     CEFTAZIDIME <=1 SENSITIVE Sensitive     CEFTRIAXONE <=1 SENSITIVE Sensitive     CIPROFLOXACIN <=0.25 SENSITIVE Sensitive     GENTAMICIN <=1 SENSITIVE Sensitive     IMIPENEM <=0.25 SENSITIVE Sensitive     TRIMETH/SULFA <=20 SENSITIVE Sensitive     AMPICILLIN/SULBACTAM <=2 SENSITIVE Sensitive     PIP/TAZO <=4 SENSITIVE Sensitive     * ESCHERICHIA COLI  Blood Culture ID Panel (Reflexed)     Status: Abnormal   Collection Time: 06/28/15 11:18 AM  Result Value Ref Range Status   Enterococcus species NOT DETECTED NOT DETECTED Final   Vancomycin resistance NOT DETECTED NOT DETECTED Final   Listeria monocytogenes NOT DETECTED NOT DETECTED Final   Staphylococcus species NOT DETECTED NOT DETECTED Final   Staphylococcus aureus NOT DETECTED NOT DETECTED Final   Methicillin resistance NOT DETECTED NOT DETECTED Final   Streptococcus species NOT DETECTED NOT DETECTED Final   Streptococcus agalactiae NOT DETECTED NOT DETECTED Final   Streptococcus pneumoniae NOT DETECTED NOT DETECTED Final   Streptococcus pyogenes NOT DETECTED NOT DETECTED Final   Acinetobacter baumannii NOT DETECTED NOT DETECTED Final   Enterobacteriaceae species DETECTED (A) NOT DETECTED Corrected    Comment: CRITICAL RESULT CALLED TO, READ BACK BY AND VERIFIED WITH: C.SUMME [id number] @0806  M.MOHR CORRECTED ON 06/21 AT 0812: PREVIOUSLY REPORTED AS DETECTED C.SUMME [id number] @0806  M.MOHR    Enterobacter cloacae complex NOT DETECTED NOT DETECTED Corrected    Comment: CORRECTED ON 06/21 AT 2130: PREVIOUSLY REPORTED AS DETECTED CRITICAL RESULT  CALLED TO, READ BACK BY AND VERIFIED WITH: C.SUMME [id number] @0806  M.MOHR   Escherichia coli DETECTED (A) NOT DETECTED Corrected    Comment: CRITICAL RESULT CALLED TO, READ BACK BY AND VERIFIED WITH: C.SUMME [id number] 0806 M.MOHR CORRECTED ON 06/21 AT 2841: PREVIOUSLY REPORTED AS NOT DETECTED    Klebsiella oxytoca NOT DETECTED NOT DETECTED Final   Klebsiella pneumoniae NOT DETECTED NOT DETECTED Final   Proteus species NOT DETECTED NOT DETECTED Final   Serratia marcescens NOT DETECTED NOT DETECTED Final   Carbapenem resistance NOT DETECTED NOT DETECTED Final   Haemophilus influenzae NOT DETECTED NOT DETECTED Final   Neisseria meningitidis NOT DETECTED NOT DETECTED Final   Pseudomonas aeruginosa NOT DETECTED NOT DETECTED Final   Candida albicans NOT DETECTED NOT DETECTED Final   Candida glabrata NOT DETECTED NOT DETECTED Final  Candida krusei NOT DETECTED NOT DETECTED Final   Candida parapsilosis NOT DETECTED NOT DETECTED Final   Candida tropicalis NOT DETECTED NOT DETECTED Final    Comment: Performed at [facility]  Urine culture     Status: Abnormal   Collection Time: 06/28/15  3:41 PM  Result Value Ref Range Status   Specimen Description URINE, CATHETERIZED  Final   Special Requests NONE  Final   Culture 20,000 COLONIES/mL ESCHERICHIA COLI (A)  Final   Report Status 06/30/2015 FINAL  Final   Organism ID, Bacteria ESCHERICHIA COLI (A)  Final      Susceptibility   Escherichia coli - MIC*    AMPICILLIN <=2 SENSITIVE Sensitive     CEFAZOLIN <=4 SENSITIVE Sensitive     CEFTRIAXONE <=1 SENSITIVE Sensitive     CIPROFLOXACIN <=0.25 SENSITIVE Sensitive     GENTAMICIN <=1 SENSITIVE Sensitive     IMIPENEM <=0.25 SENSITIVE Sensitive     NITROFURANTOIN <=16 SENSITIVE Sensitive     TRIMETH/SULFA <=20 SENSITIVE Sensitive     AMPICILLIN/SULBACTAM <=2 SENSITIVE Sensitive     PIP/TAZO <=4 SENSITIVE Sensitive     * 20,000 COLONIES/mL ESCHERICHIA COLI  MRSA PCR Screening     Status: None    Collection Time: 06/28/15  4:01 PM  Result Value Ref Range Status   MRSA by PCR NEGATIVE NEGATIVE Final    Comment:        The GeneXpert MRSA Assay (FDA approved for NASAL specimens only), is one component of a comprehensive MRSA colonization surveillance program. It is not intended to diagnose MRSA infection nor to guide or monitor treatment for MRSA infections.      Labs: BNP (last 3 results) No results for input(s): BNP in the last 8760 hours. Basic Metabolic Panel:  Recent Labs Lab 06/30/15 0400 07/01/15 0309 07/04/15 0512  NA 140 140 140  K 3.4* 3.4* 3.6  CL 117* 115* 112*  CO2 19* 20* 24  GLUCOSE 91 100* 88  BUN 29* 17 8  CREATININE 1.04* 0.66 0.47  CALCIUM 7.7* 7.7* 8.1*   Liver Function Tests: No results for input(s): AST, ALT, ALKPHOS, BILITOT, PROT, ALBUMIN in the last 168 hours. No results for input(s): LIPASE, AMYLASE in the last 168 hours. No results for input(s): AMMONIA in the last 168 hours. CBC:  Recent Labs Lab 06/30/15 0400 07/01/15 0309 07/02/15 0527 07/04/15 0512 07/05/15 0540  WBC 17.2* 21.9* 19.8* 18.1* 15.2*  HGB 9.3* 9.6* 9.5* 10.0* 10.2*  HCT 26.6* 27.8* 27.3* 29.3* 30.2*  MCV 83.9 84.5 82.7 85.4 86.0  PLT 58* 67* 92* 190 252   Cardiac Enzymes: No results for input(s): CKTOTAL, CKMB, CKMBINDEX, TROPONINI in the last 168 hours. BNP: Invalid input(s): POCBNP CBG: No results for input(s): GLUCAP in the last 168 hours. D-Dimer No results for input(s): DDIMER in the last 72 hours. Hgb A1c No results for input(s): HGBA1C in the last 72 hours. Lipid Profile No results for input(s): CHOL, HDL, LDLCALC, TRIG, CHOLHDL, LDLDIRECT in the last 72 hours. Thyroid function studies No results for input(s): TSH, T4TOTAL, T3FREE, THYROIDAB in the last 72 hours.  Invalid input(s): FREET3 Anemia work up No results for input(s): VITAMINB12, FOLATE, FERRITIN, TIBC, IRON, RETICCTPCT in the last 72 hours. Urinalysis    Component Value  Date/Time   COLORURINE YELLOW 06/28/2015 2300   APPEARANCEUR CLOUDY* 06/28/2015 2300   LABSPEC 1.008 06/28/2015 2300   PHURINE 5.5 06/28/2015 2300   GLUCOSEU NEGATIVE 06/28/2015 2300   HGBUR LARGE* 06/28/2015 2300  BILIRUBINUR NEGATIVE 06/28/2015 2300   KETONESUR NEGATIVE 06/28/2015 2300   PROTEINUR 30* 06/28/2015 2300   UROBILINOGEN 1.0 11/21/2007 2200   NITRITE NEGATIVE 06/28/2015 2300   LEUKOCYTESUR MODERATE* 06/28/2015 2300   Sepsis Labs Invalid input(s): PROCALCITONIN,  WBC,  LACTICIDVEN Microbiology Recent Results (from the past 240 hour(s))  Blood Culture (routine x 2)     Status: Abnormal   Collection Time: 06/28/15 11:00 AM  Result Value Ref Range Status   Specimen Description BLOOD LEFT ARM  Final   Special Requests BOTTLES DRAWN AEROBIC AND ANAEROBIC 5 CC  Final   Culture  Setup Time   Final    GRAM NEGATIVE RODS IN BOTH AEROBIC AND ANAEROBIC BOTTLES CRITICAL RESULT CALLED TO, READ BACK BY AND VERIFIED WITH: C.SUMME [id number] @0806  M.MOHR    Culture (A)  Final    ESCHERICHIA COLI SUSCEPTIBILITIES PERFORMED ON PREVIOUS CULTURE WITHIN THE LAST 5 DAYS. Performed at [facility]    Report Status 07/01/2015 FINAL  Final  Blood Culture (routine x 2)     Status: Abnormal   Collection Time: 06/28/15 11:18 AM  Result Value Ref Range Status   Specimen Description BLOOD LEFT HAND  Final   Special Requests BOTTLES DRAWN AEROBIC ONLY 5CC  Final   Culture  Setup Time   Final    GRAM NEGATIVE RODS AEROBIC BOTTLE ONLY Organism ID to follow CRITICAL RESULT CALLED TO, READ BACK BY AND VERIFIED WITH: C.SUMME [id number] @ 0806  M.MOHR Performed at [facility]    Culture ESCHERICHIA COLI (A)  Final   Report Status 07/01/2015 FINAL  Final   Organism ID, Bacteria ESCHERICHIA COLI  Final      Susceptibility   Escherichia coli - MIC*    AMPICILLIN <=2 SENSITIVE Sensitive     CEFAZOLIN <=4 SENSITIVE Sensitive     CEFEPIME <=1 SENSITIVE Sensitive     CEFTAZIDIME <=1  SENSITIVE Sensitive     CEFTRIAXONE <=1 SENSITIVE Sensitive     CIPROFLOXACIN <=0.25 SENSITIVE Sensitive     GENTAMICIN <=1 SENSITIVE Sensitive     IMIPENEM <=0.25 SENSITIVE Sensitive     TRIMETH/SULFA <=20 SENSITIVE Sensitive     AMPICILLIN/SULBACTAM <=2 SENSITIVE Sensitive     PIP/TAZO <=4 SENSITIVE Sensitive     * ESCHERICHIA COLI  Blood Culture ID Panel (Reflexed)     Status: Abnormal   Collection Time: 06/28/15 11:18 AM  Result Value Ref Range Status   Enterococcus species NOT DETECTED NOT DETECTED Final   Vancomycin resistance NOT DETECTED NOT DETECTED Final   Listeria monocytogenes NOT DETECTED NOT DETECTED Final   Staphylococcus species NOT DETECTED NOT DETECTED Final   Staphylococcus aureus NOT DETECTED NOT DETECTED Final   Methicillin resistance NOT DETECTED NOT DETECTED Final   Streptococcus species NOT DETECTED NOT DETECTED Final   Streptococcus agalactiae NOT DETECTED NOT DETECTED Final   Streptococcus pneumoniae NOT DETECTED NOT DETECTED Final   Streptococcus pyogenes NOT DETECTED NOT DETECTED Final   Acinetobacter baumannii NOT DETECTED NOT DETECTED Final   Enterobacteriaceae species DETECTED (A) NOT DETECTED Corrected    Comment: CRITICAL RESULT CALLED TO, READ BACK BY AND VERIFIED WITH: C.SUMME [id number] @0806  M.MOHR CORRECTED ON 06/21 AT 0812: PREVIOUSLY REPORTED AS DETECTED C.SUMME [id number] @0806  M.MOHR    Enterobacter cloacae complex NOT DETECTED NOT DETECTED Corrected    Comment: CORRECTED ON 06/21 AT [id number]: PREVIOUSLY REPORTED AS DETECTED CRITICAL RESULT CALLED TO, READ BACK BY AND VERIFIED WITH: C.SUMME [id number] @0806  M.MOHR   Escherichia coli DETECTED (A)  NOT DETECTED Corrected    Comment: CRITICAL RESULT CALLED TO, READ BACK BY AND VERIFIED WITH: C.SUMME [id number] M.MOHR CORRECTED ON 06/21 AT [id number]: PREVIOUSLY REPORTED AS NOT DETECTED    Klebsiella oxytoca NOT DETECTED NOT DETECTED Final   Klebsiella pneumoniae NOT DETECTED NOT DETECTED Final   Proteus species  NOT DETECTED NOT DETECTED Final   Serratia marcescens NOT DETECTED NOT DETECTED Final   Carbapenem resistance NOT DETECTED NOT DETECTED Final   Haemophilus influenzae NOT DETECTED NOT DETECTED Final   Neisseria meningitidis NOT DETECTED NOT DETECTED Final   Pseudomonas aeruginosa NOT DETECTED NOT DETECTED Final   Candida albicans NOT DETECTED NOT DETECTED Final   Candida glabrata NOT DETECTED NOT DETECTED Final   Candida krusei NOT DETECTED NOT DETECTED Final   Candida parapsilosis NOT DETECTED NOT DETECTED Final   Candida tropicalis NOT DETECTED NOT DETECTED Final    Comment: Performed at [facility]  Urine culture     Status: Abnormal   Collection Time: 06/28/15  3:41 PM  Result Value Ref Range Status   Specimen Description URINE, CATHETERIZED  Final   Special Requests NONE  Final   Culture 20,000 COLONIES/mL ESCHERICHIA COLI (A)  Final   Report Status 06/30/2015 FINAL  Final   Organism ID, Bacteria ESCHERICHIA COLI (A)  Final      Susceptibility   Escherichia coli - MIC*    AMPICILLIN <=2 SENSITIVE Sensitive     CEFAZOLIN <=4 SENSITIVE Sensitive     CEFTRIAXONE <=1 SENSITIVE Sensitive     CIPROFLOXACIN <=0.25 SENSITIVE Sensitive     GENTAMICIN <=1 SENSITIVE Sensitive     IMIPENEM <=0.25 SENSITIVE Sensitive     NITROFURANTOIN <=16 SENSITIVE Sensitive     TRIMETH/SULFA <=20 SENSITIVE Sensitive     AMPICILLIN/SULBACTAM <=2 SENSITIVE Sensitive     PIP/TAZO <=4 SENSITIVE Sensitive     * 20,000 COLONIES/mL ESCHERICHIA COLI  MRSA PCR Screening     Status: None   Collection Time: 06/28/15  4:01 PM  Result Value Ref Range Status   MRSA by PCR NEGATIVE NEGATIVE Final    Comment:        The GeneXpert MRSA Assay (FDA approved for NASAL specimens only), is one component of a comprehensive MRSA colonization surveillance program. It is not intended to diagnose MRSA infection nor to guide or monitor treatment for MRSA infections.      Time coordinating discharge: Over 30  minutes  SIGNED:   Eddie NorthHUNGEL, Puneet Masoner, MD  [facility] 07/06/2015, 11:36 AM Pager   If 7PM-7AM, please contact night-coverage [email or web address] Password [email or web address]

## 2015-07-06 NOTE — Discharge Instructions (Signed)
Percutaneous Nephrostomy °Percutaneous nephrostomy is the insertion of a flexible tube into your kidney through your back. This is done to provide access to an obstructed kidney. The goal of this procedure is to allow the urine that is produced in the kidney to drain, which will relieve pressure or infection from damaging your kidney. This will allow your health care provider to identify the cause of the obstruction and plan appropriate treatment. °LET YOUR HEALTH CARE PROVIDER KNOW ABOUT: °· Any allergies you have. °· All medicines you are taking, including vitamins, herbs, eye drops, creams, and over-the-counter medicines. °· Previous problems you or members of your family have had with the use of anesthetics. °· Any blood disorders you have. °· Previous surgeries you have had. °· Medical conditions you have. °· Possibility of pregnancy, if this applies. °RISKS AND COMPLICATIONS °Generally, this is a safe procedure. However, as with any procedure, problems can occur. Possible problems include: °· Infection. °· Damage to the organs surrounding your kidney. °BEFORE THE PROCEDURE °Your health care provider may want you to have blood tests. These tests can help tell how well your kidneys and liver are working. They can also show how well your blood clots. If you take anticoagulant medicine, sometimes called blood thinners, ask your health care provider when you should stop taking them. °Make arrangements for someone to take you home after the procedure, if needed. °PROCEDURE °The procedure is performed as follows: °·  An intravenous IV catheter will be inserted into one of the veins in your arm. Medicine will be able to flow directly into your body through this catheter. You may be given medicines through this tube to help prevent nausea and pain, and antibiotics to help prevent infection.   °· You will be placed on your stomach and given medicine that numbs the site (local anesthetic) where the percutaneous  nephrostomy tube will be inserted. °· You will be given a medicine that makes you go to sleep (general anesthetic). °· The percutaneous nephrostomy tube, which is thin and flexible, will be inserted into a needle. °· The needle will be inserted into your body and guided to your kidney with the help of an imaging method that uses X-ray images (fluoroscopy). °· A dye will be injected through the nephrostomy tube. Then, X-ray images that highlight your kidney will be taken. °· The needle is then removed, but the nephrostomy tube will be left in your kidney. The tube will drain urine from your kidney to a collection bag outside your body. The tube is usually secured to your skin with stitches (sutures). °AFTER THE PROCEDURE  °· You will stay in a recovery area until the sedation has worn off. Your blood pressure and pulse will be checked. °· You will need to remain lying down for several hours. °  °This information is not intended to replace advice given to you by your health care provider. Make sure you discuss any questions you have with your health care provider. °  °Document Released: 10/15/2012 Document Revised: 01/15/2014 Document Reviewed: 10/15/2012 °Elsevier Interactive Patient Education ©2016 Elsevier Inc. ° °

## 2015-07-06 NOTE — Progress Notes (Signed)
[id number]/Bryli Mantey Sharlet SalinaDavis, BSN,RN3,CCM [phone]: [facility] for home nephrostomy tube care/[facility] with [facility] notified.

## 2015-07-14 ENCOUNTER — Encounter: Payer: Self-pay | Admitting: Primary Care

## 2015-07-14 ENCOUNTER — Ambulatory Visit (INDEPENDENT_AMBULATORY_CARE_PROVIDER_SITE_OTHER): Payer: BC Managed Care – PPO | Admitting: Primary Care

## 2015-07-14 VITALS — BP 112/70 | HR 74 | Temp 98.2°F | Wt 125.0 lb

## 2015-07-14 DIAGNOSIS — N10 Acute tubulo-interstitial nephritis: Secondary | ICD-10-CM | POA: Diagnosis not present

## 2015-07-14 DIAGNOSIS — Z09 Encounter for follow-up examination after completed treatment for conditions other than malignant neoplasm: Secondary | ICD-10-CM | POA: Diagnosis not present

## 2015-07-14 LAB — CBC
HCT: 34.4 % — ABNORMAL LOW (ref 36.0–46.0)
Hemoglobin: 11.4 g/dL — ABNORMAL LOW (ref 12.0–15.0)
MCHC: 33.3 g/dL (ref 30.0–36.0)
MCV: 86.1 fl (ref 78.0–100.0)
PLATELETS: 527 10*3/uL — AB (ref 150.0–400.0)
RBC: 3.99 Mil/uL (ref 3.87–5.11)
RDW: 13.6 % (ref 11.5–15.5)
WBC: 5.2 10*3/uL (ref 4.0–10.5)

## 2015-07-14 LAB — BASIC METABOLIC PANEL
BUN: 11 mg/dL (ref 6–23)
CALCIUM: 9.8 mg/dL (ref 8.4–10.5)
CHLORIDE: 105 meq/L (ref 96–112)
CO2: 28 meq/L (ref 19–32)
CREATININE: 0.68 mg/dL (ref 0.40–1.20)
GFR: 102.68 mL/min (ref >60.00)
Glucose, Bld: 81 mg/dL (ref 70–99)
Potassium: 4 mEq/L (ref 3.5–5.1)
SODIUM: 139 meq/L (ref 135–145)

## 2015-07-14 NOTE — Patient Instructions (Signed)
Complete lab work prior to leaving today. I will notify you of your results once received.   Follow up with Urology as scheduled.  Please don't hesitate to call me if you have any questions or concerns.  It was a pleasure meeting you!

## 2015-07-14 NOTE — Progress Notes (Signed)
Subjective:    Patient ID: Courtney [name], female    DOB: 03-Nov-1975, 40 y.o.   MRN: [id number]  HPI  Ms. Courtney [name] is a 40 year old female who presents today for hospital follow up.  She presented to the Emergency Department on 06/26/15 with complaints of right sided flank pain, nausea, vomiting. She was found to have a right ureteral stone with associated obstructive uropathy. She was discharged home with prescriptions for Flomax, Vicodin, ibuprofen, and Zofran. She then presented again on 06/28/15 after increased symptoms, and was noted to appear toxic and hypotensive. Her initial creatinine was 4.53 on 06/20 (0.88 during her visit 2 days prior). She was admitted for septic shock secondary to pylenephritis in the setting of an obstructing right nephrolithiasis, under critical care services.  During her stay in the hospital she underwent central line access, vasoactive drips, antibiotics, and further evaluation by Urology for a 6 mm obstructive ureteral stone. She underwent right percutaneous nephrostomy tube placement per IR on 06/20. Vasopressors were discontinued on 06/22. She gradually improved with IV antibiotics and fluids and was discharged home on 06/28. AKI improved. She was provided with 2 weeks of oral antibiotics and a referral to Urology (Dr. Patsi Searsannenbaum) for surgical removal of obstructive stone. She was advised to continue Flomax and follow up with urology as scheduled.  Since her discharge home she's completed her oral antibiotics and is continuing the Flomax. She has an appointment with Urology scheduled for tomorrow to discuss removal of her stone. Overall she's feeling improved. Denies fevers, chills, weakness. She has noticed intermittent discomfort to the hamstrings of her right lower extremity when walking upstairs but otherwise no discomfort. Her husband is changing her dressing for her nephrostomy tube. Good urine output with clear color.  Review of Systems  Constitutional:  Negative for fever, chills and fatigue.  Respiratory: Negative for shortness of breath.   Gastrointestinal: Negative for abdominal pain.  Genitourinary: Negative for hematuria, flank pain and decreased urine volume.  Neurological: Negative for dizziness and weakness.       No past medical history on file.   Social History   Social History  . Marital Status: Married    Spouse Name: N/A  . Number of Children: N/A  . Years of Education: N/A   Occupational History  . Not on file.   Social History Main Topics  . Smoking status: Never Smoker   . Smokeless tobacco: Never Used  . Alcohol Use: 0.0 oz/week    0 Standard drinks or equivalent per week     Comment: rare  . Drug Use: No  . Sexual Activity: Yes   Other Topics Concern  . Not on file   Social History Narrative    Past Surgical History  Procedure Laterality Date  . Tubal ligation  10/29/2004    Family History  Problem Relation Age of Onset  . Cancer Mother     Breast  . Lung disease Father     No Known Allergies  Current Outpatient Prescriptions on File Prior to Visit  Medication Sig Dispense Refill  . ibuprofen (ADVIL,MOTRIN) 600 MG tablet Take 1 tablet (600 mg total) by mouth every 6 (six) hours as needed. (Patient taking differently: Take 600 mg by mouth every 6 (six) hours as needed for moderate pain. ) 30 tablet 0  . tamsulosin (FLOMAX) 0.4 MG CAPS capsule Take 1 capsule (0.4 mg total) by mouth daily. 30 capsule 0   No current facility-administered medications on file prior to  visit.    BP 112/70 mmHg  Pulse 74  Temp(Src) 98.2 F (36.8 C) (Oral)  Wt 125 lb (56.7 kg)  SpO2 97%  LMP 06/28/2015    Objective:   Physical Exam  Constitutional: She appears well-nourished. She does not appear ill.  Neck: Neck supple.  Cardiovascular: Normal rate and regular rhythm.   Pulmonary/Chest: Effort normal and breath sounds normal.  Abdominal: Soft. Bowel sounds are normal. There is no tenderness.    Genitourinary:  Nephrostomy tube to right flank and place without evidence of infection.  Skin: Skin is warm and dry.          Assessment & Plan:  Hospital follow-up:  Admitted to the hospital on June 20 for septic shock secondary to pyelonephritis in the setting of obstructive right nephrolithiasis. Underwent nephrostomy tube placement to right flank. Acute kidney injury improved upon discharge. Overall feeling well and has no complaints today.  She has scheduled follow-up with urology for July 7. Will check CBC and BMP today to ensure she has stabilized. Appears well, nephrostomy tube without evidence of acute infection. Urine clear yellow. Discussed recommendation for urology follow-up tomorrow as scheduled. Labs pending. Discussed strict return precautions.  All Hospital imaging, labs, notes reviewed.  Morrie Sheldonlark,Naraly Fritcher Kendal, NP

## 2015-07-15 ENCOUNTER — Encounter: Payer: Self-pay | Admitting: *Deleted

## 2015-07-18 ENCOUNTER — Telehealth: Payer: Self-pay | Admitting: Internal Medicine

## 2015-07-18 NOTE — Telephone Encounter (Signed)
Spoken and notified patient of Kate's comments. Patient verbalized understanding. 

## 2015-07-18 NOTE — Telephone Encounter (Signed)
Pt returning call, please call back at [phone].

## 2015-07-27 ENCOUNTER — Other Ambulatory Visit (HOSPITAL_COMMUNITY): Payer: Self-pay | Admitting: Urology

## 2015-07-27 DIAGNOSIS — N201 Calculus of ureter: Secondary | ICD-10-CM

## 2015-08-01 ENCOUNTER — Ambulatory Visit (HOSPITAL_COMMUNITY)
Admission: RE | Admit: 2015-08-01 | Discharge: 2015-08-01 | Disposition: A | Discharge status: Home / Self Care | Payer: BC Managed Care – PPO | Source: Ambulatory Visit | Attending: Urology | Admitting: Urology

## 2015-08-01 ENCOUNTER — Encounter (HOSPITAL_COMMUNITY): Payer: Self-pay | Admitting: Interventional Radiology

## 2015-08-01 DIAGNOSIS — N201 Calculus of ureter: Secondary | ICD-10-CM

## 2015-08-01 DIAGNOSIS — Z87442 Personal history of urinary calculi: Secondary | ICD-10-CM | POA: Diagnosis not present

## 2015-08-01 DIAGNOSIS — Z436 Encounter for attention to other artificial openings of urinary tract: Secondary | ICD-10-CM | POA: Insufficient documentation

## 2015-08-01 HISTORY — PX: IR GENERIC HISTORICAL: IMG1180011

## 2015-08-01 MED ORDER — IOPAMIDOL (ISOVUE-300) INJECTION 61%
50.0000 mL | Freq: Once | INTRAVENOUS | Status: AC | PRN
  Administered 2015-08-01: 10 mL

## 2015-08-01 NOTE — Procedures (Signed)
NL RT nephrostogram RT PCN REMOVED No comp Stable Full report in PACS

## 2017-02-25 ENCOUNTER — Ambulatory Visit: Payer: BC Managed Care – PPO | Admitting: Family Medicine

## 2017-02-25 DIAGNOSIS — Z0289 Encounter for other administrative examinations: Secondary | ICD-10-CM

## 2017-03-04 ENCOUNTER — Ambulatory Visit (INDEPENDENT_AMBULATORY_CARE_PROVIDER_SITE_OTHER): Payer: BC Managed Care – PPO | Admitting: Internal Medicine

## 2017-03-04 ENCOUNTER — Encounter: Payer: Self-pay | Admitting: Internal Medicine

## 2017-03-04 VITALS — BP 110/60 | HR 104 | Temp 98.4°F | Ht 65.0 in | Wt 140.1 lb

## 2017-03-04 DIAGNOSIS — J069 Acute upper respiratory infection, unspecified: Secondary | ICD-10-CM

## 2017-03-04 DIAGNOSIS — R6889 Other general symptoms and signs: Secondary | ICD-10-CM

## 2017-03-04 LAB — POC INFLUENZA A&B (BINAX/QUICKVUE)
Influenza A, POC: NEGATIVE
Influenza B, POC: NEGATIVE

## 2017-03-04 MED ORDER — AZITHROMYCIN 250 MG PO TABS: ORAL_TABLET | ORAL | 0 refills | Status: DC

## 2017-03-04 NOTE — Progress Notes (Signed)
HPI  Pt presents to the clinic today with c/o headache, runny nose and cough. This started 1 week ago. She describes the headache as pressure. She denies dizziness or visual changes. The cough is productive of yellow mucous. She reports associated shortness of breath or chest pain. She denies fever, but has had chills and body aches. She has tried Mucinex and Dayquil with minimal relief. She has had sick contacts diagnosed with the flu.   Review of Systems     History reviewed. No pertinent past medical history.  Family History  Problem Relation Age of Onset  . Cancer Mother        Breast  . Lung disease Father     Social History   Socioeconomic History  . Marital status: Married    Spouse name: Not on file  . Number of children: Not on file  . Years of education: Not on file  . Highest education level: Not on file  Social Needs  . Financial resource strain: Not on file  . Food insecurity - worry: Not on file  . Food insecurity - inability: Not on file  . Transportation needs - medical: Not on file  . Transportation needs - non-medical: Not on file  Occupational History  . Not on file  Tobacco Use  . Smoking status: Never Smoker  . Smokeless tobacco: Never Used  Substance and Sexual Activity  . Alcohol use: Yes    Alcohol/week: 0.0 oz    Comment: rare  . Drug use: No  . Sexual activity: Yes  Other Topics Concern  . Not on file  Social History Narrative  . Not on file    No Known Allergies   Constitutional: Positive headache, fatigue. Denies fever, abrupt weight changes.  HEENT:  Positive runny nose. Denies eye redness, eye pain, pressure behind the eyes, facial pain, nasal congestion, ear pain, ringing in the ears, wax buildup, or bloody nose. Respiratory: Positive cough. Denies difficulty breathing or shortness of breath.  Cardiovascular: Denies chest pain, chest tightness, palpitations or swelling in the hands or feet.   No other specific complaints in a  complete review of systems (except as listed in HPI above).  Objective:   BP 110/60 (BP Location: Left Arm, Patient Position: Sitting, Cuff Size: Normal)   Pulse (!) 104   Temp 98.4 F (36.9 C) (Oral)   Ht 5\' 5"  (1.651 m)   Wt 140 lb 1.9 oz (63.6 kg)   LMP 02/24/2017   SpO2 98%   BMI 23.32 kg/m   Wt Readings from Last 3 Encounters:  07/14/15 125 lb (56.7 kg)  07/06/15 141 lb 8.6 oz (64.2 kg)  04/13/14 129 lb (58.5 kg)     General: Appears her stated age, ill appearing, in NAD. HEENT: Head: normal shape and size, no sinus tenderness noted; Ears: Tm's gray and intact, normal light reflex; Nose: mucosa pink and moist, septum midline; Throat/Mouth: + PND. Teeth present, mucosa erythematous and moist, no exudate noted, no lesions or ulcerations noted.  Neck: No cervical lymphadenopathy.  Cardiovascular: Tachycardic with normal rhythm. S1,S2 noted.  No murmur, rubs or gallops noted.  Pulmonary/Chest: Normal effort and positive vesicular breath sounds. No respiratory distress. No wheezes, rales or ronchi noted.       Assessment & Plan:   Upper Respiratory Infection:  Rapid Flu: negative Get some rest and drink plenty of water Tylenol/Ibuprofen for  eRx for Azithromax x 5 days Delsym as needed for cough  RTC as needed or if  symptoms persist.   Nicki ReaperBAITY, REGINA, NP

## 2017-03-04 NOTE — Patient Instructions (Signed)
Upper Respiratory Infection, Adult Most upper respiratory infections (URIs) are caused by a virus. A URI affects the nose, throat, and upper air passages. The most common type of URI is often called "the common cold." Follow these instructions at home:  Take medicines only as told by your doctor.  Gargle warm saltwater or take cough drops to comfort your throat as told by your doctor.  Use a warm mist humidifier or inhale steam from a shower to increase air moisture. This may make it easier to breathe.  Drink enough fluid to keep your pee (urine) clear or pale yellow.  Eat soups and other clear broths.  Have a healthy diet.  Rest as needed.  Go back to work when your fever is gone or your doctor says it is okay. ? You may need to stay home longer to avoid giving your URI to others. ? You can also wear a face mask and wash your hands often to prevent spread of the virus.  Use your inhaler more if you have asthma.  Do not use any tobacco products, including cigarettes, chewing tobacco, or electronic cigarettes. If you need help quitting, ask your doctor. Contact a doctor if:  You are getting worse, not better.  Your symptoms are not helped by medicine.  You have chills.  You are getting more short of breath.  You have brown or red mucus.  You have yellow or brown discharge from your nose.  You have pain in your face, especially when you bend forward.  You have a fever.  You have puffy (swollen) neck glands.  You have pain while swallowing.  You have white areas in the back of your throat. Get help right away if:  You have very bad or constant: ? Headache. ? Ear pain. ? Pain in your forehead, behind your eyes, and over your cheekbones (sinus pain). ? Chest pain.  You have long-lasting (chronic) lung disease and any of the following: ? Wheezing. ? Long-lasting cough. ? Coughing up blood. ? A change in your usual mucus.  You have a stiff neck.  You have  changes in your: ? Vision. ? Hearing. ? Thinking. ? Mood. This information is not intended to replace advice given to you by your health care provider. Make sure you discuss any questions you have with your health care provider. Document Released: 06/13/2007 Document Revised: 08/28/2015 Document Reviewed: 04/01/2013 Elsevier Interactive Patient Education  2018 Elsevier Inc.  

## 2017-03-18 ENCOUNTER — Telehealth: Payer: Self-pay | Admitting: Internal Medicine

## 2017-03-18 NOTE — Telephone Encounter (Signed)
Left detailed msg on VM per HIPAA  

## 2017-03-18 NOTE — Addendum Note (Signed)
Addended by: Roena MaladyEVONTENNO, Nazariah Cadet Y on: 03/18/2017 03:07 PM   Modules accepted: Orders

## 2017-03-18 NOTE — Telephone Encounter (Signed)
Copied from CRM [phone]. Topic: Quick Communication - See Telephone Encounter >> Mar 18, 2017 10:27 AM Arlyss Gandyichardson, Abid Bolla N, NT wrote: CRM for notification. See Telephone encounter for: Pt was seen last week with Nicki Reaperegina Baity and was prescribed a zpac. Pt has finished the zpac and is still feeling sick with congestion and cough. She would like to see what she should do next to help get better.   03/18/17.

## 2017-03-18 NOTE — Telephone Encounter (Signed)
Pt states that she is not having any wheezing or feeling SOB... Pt reports headache, fever of 100 this morning and productive cough yellow sputum... Please advise

## 2017-03-18 NOTE — Telephone Encounter (Signed)
She needs reevaluation. Can she been seen tomorrow?

## 2017-03-18 NOTE — Telephone Encounter (Signed)
Is she wheezing or feeling SOB. If Azithromycin did not help, this was likely viral and will just resolve on it's own. If worse, she may want to be seen for reevaluation to make sure this hasn't turned into a bronchitis or pneumonia.

## 2017-03-19 ENCOUNTER — Ambulatory Visit: Payer: BC Managed Care – PPO | Admitting: Internal Medicine

## 2017-03-19 DIAGNOSIS — Z0289 Encounter for other administrative examinations: Secondary | ICD-10-CM

## 2017-06-02 IMAGING — DX DG CHEST 1V PORT
1 series · 1 of 1 positions shown · non-contrast
Comparison: 06/28/2015

CLINICAL DATA: Urosepsis, central line placement

EXAM:
PORTABLE CHEST 1 VIEW

[chest ap]
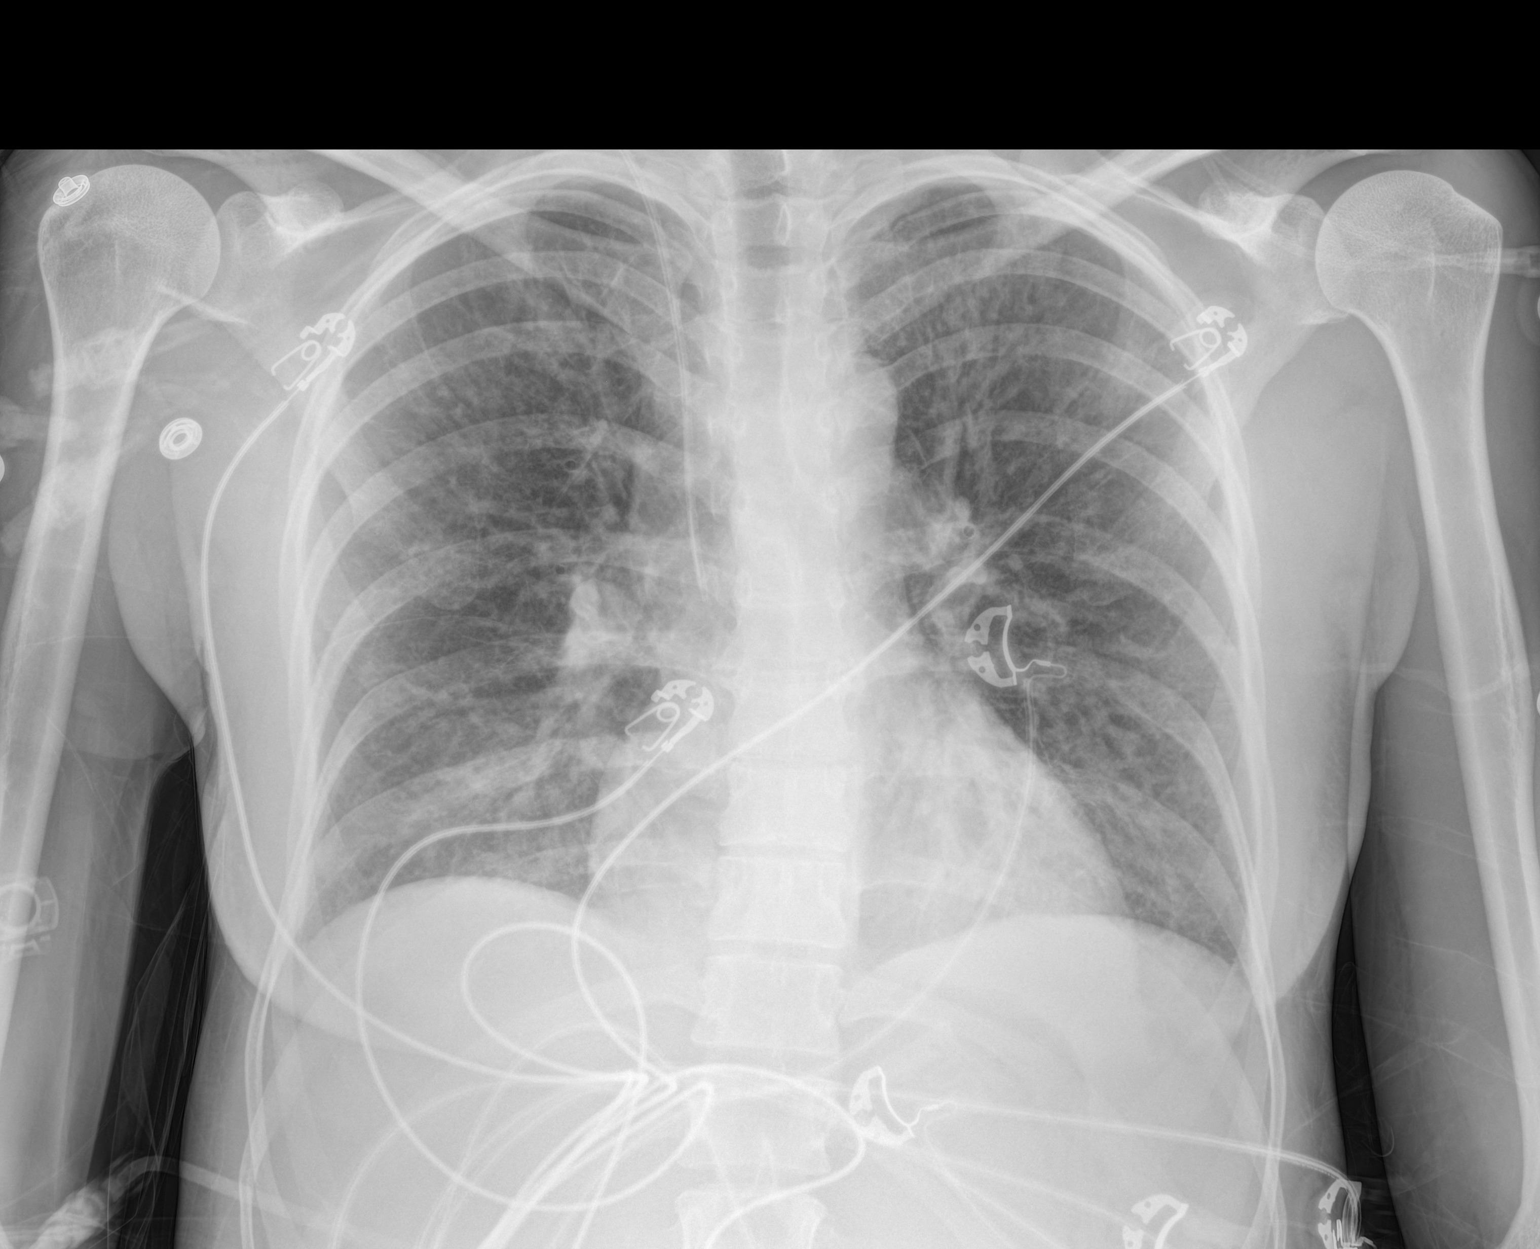

[1 of 1 positions shown; findings below may reference images not displayed]

FINDINGS: Cardiomediastinal silhouette is stable. Central mild vascular
congestion again noted. There is right IJ central line with tip in
SVC. No pneumothorax. No segmental infiltrate.
IMPRESSION: Central vascular congestion again noted. Right IJ central line in
place. No pneumothorax.

## 2017-06-02 IMAGING — DX DG CHEST 1V PORT
1 series · 1 of 1 positions shown · non-contrast
Comparison: 06/26/2015 CT scan

CLINICAL DATA: Hypotension.  Weakness and fatigue.

EXAM:
PORTABLE CHEST 1 VIEW

[chest ap]
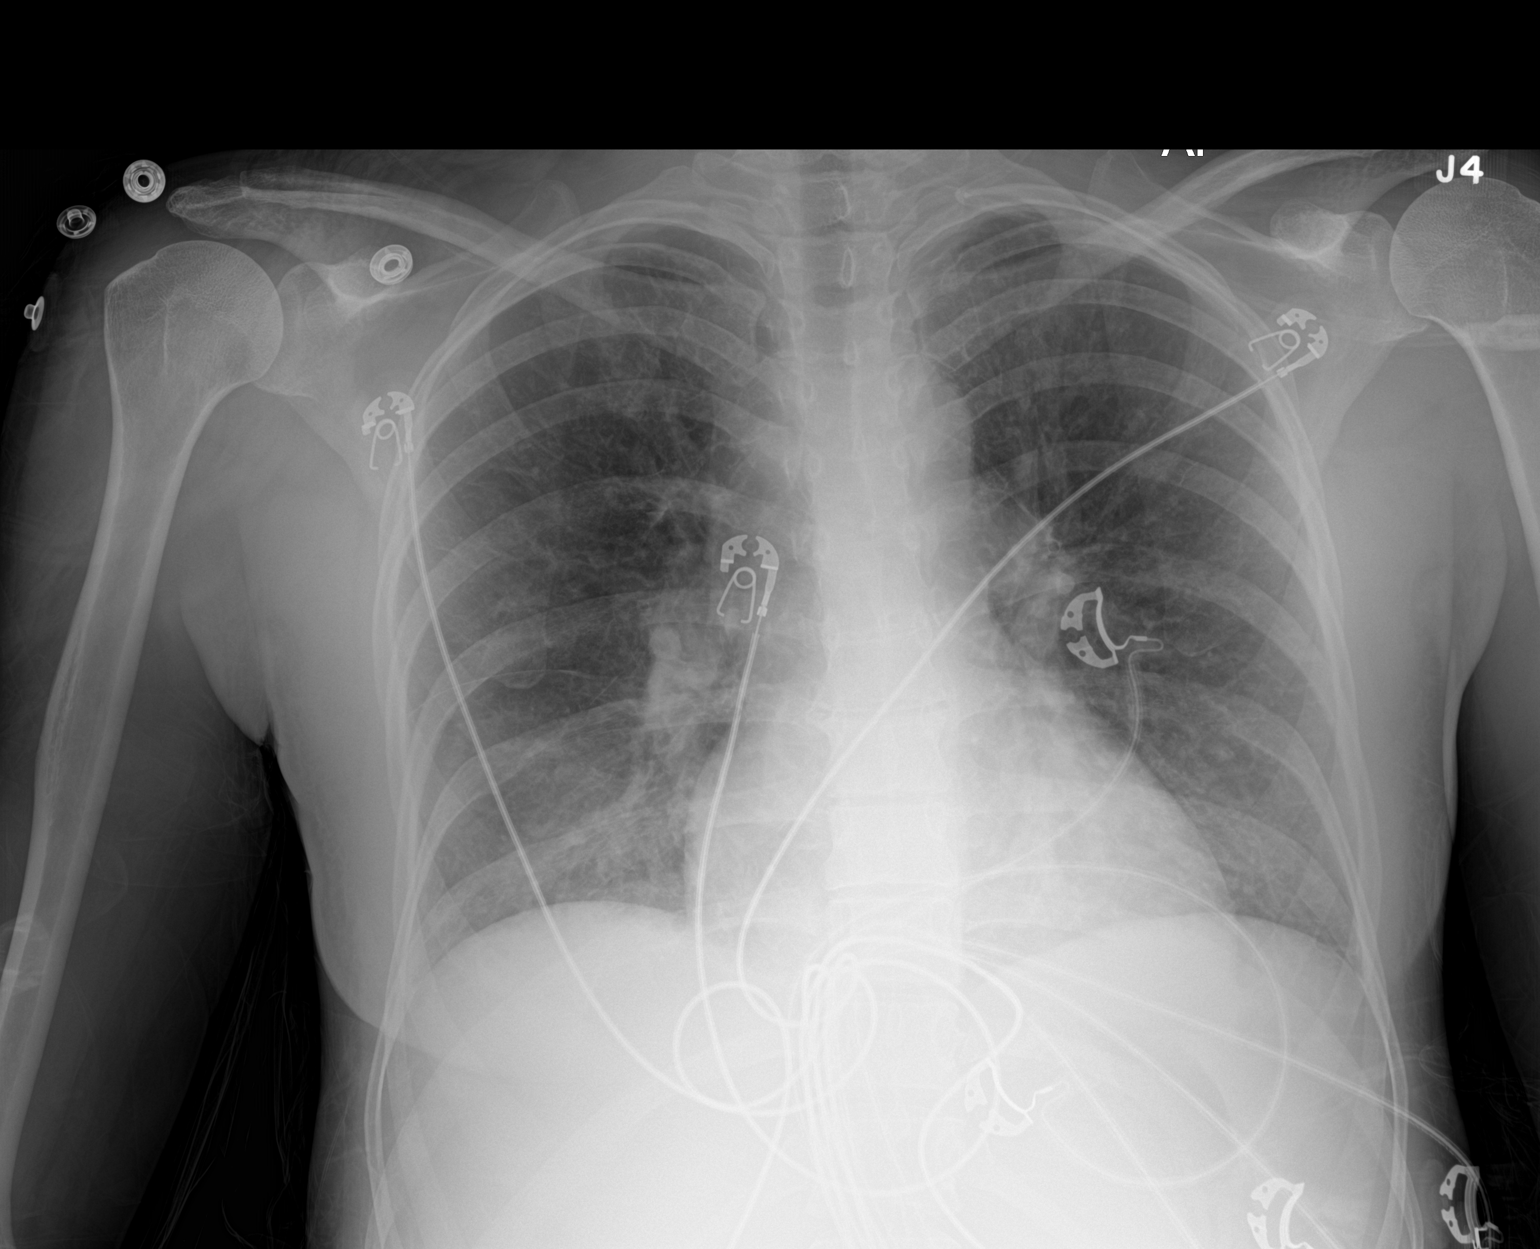

[1 of 1 positions shown; findings below may reference images not displayed]

FINDINGS: Mild enlargement of the cardiopericardial silhouette with indistinct
upper zone pulmonary vasculature. Lungs appear otherwise clear.

No pleural effusion.  No significant bony abnormality observed.
IMPRESSION: 1. Mild enlargement of the cardiopericardial silhouette with
suspicion for pulmonary venous hypertension. No overt edema.

## 2017-06-24 ENCOUNTER — Ambulatory Visit: Payer: BC Managed Care – PPO | Admitting: Internal Medicine

## 2017-06-24 ENCOUNTER — Encounter: Payer: Self-pay | Admitting: Internal Medicine

## 2017-06-24 VITALS — BP 118/78 | HR 81 | Temp 98.7°F | Wt 147.0 lb

## 2017-06-24 DIAGNOSIS — B9689 Other specified bacterial agents as the cause of diseases classified elsewhere: Secondary | ICD-10-CM

## 2017-06-24 DIAGNOSIS — N898 Other specified noninflammatory disorders of vagina: Secondary | ICD-10-CM

## 2017-06-24 DIAGNOSIS — N760 Acute vaginitis: Secondary | ICD-10-CM

## 2017-06-24 DIAGNOSIS — B3731 Acute candidiasis of vulva and vagina: Secondary | ICD-10-CM

## 2017-06-24 DIAGNOSIS — B373 Candidiasis of vulva and vagina: Secondary | ICD-10-CM

## 2017-06-24 MED ORDER — METRONIDAZOLE 0.75 % VA GEL: 1.0000 | Freq: Two times a day (BID) | VAGINAL | 0 refills | Status: DC

## 2017-06-24 MED ORDER — FLUCONAZOLE 150 MG PO TABS: 150.0000 mg | ORAL_TABLET | Freq: Once | ORAL | 0 refills | Status: AC

## 2017-06-24 NOTE — Patient Instructions (Signed)

## 2017-06-24 NOTE — Progress Notes (Signed)
Subjective:    Patient ID: Courtney [name], female    DOB: [birth year], 42 y.o.   MRN: [id number]  HPI  Pt presents to the clinic today with c/o a irritation in her vaginal region. She reports this started 5 days ago. The area is itching. She feels like the labia is swollen. She denies vaginal discharge, odor, or bleeding. She denies recent bubble bath or using douches. She has not tried anything OTC for her symptoms. She is sexually active in a monogamous relationship.   Review of Systems      No past medical history on file.  No current outpatient medications on file.   No current facility-administered medications for this visit.     No Known Allergies  Family History  Problem Relation Age of Onset  . Cancer Mother        Breast  . Lung disease Father     Social History   Socioeconomic History  . Marital status: Married    Spouse name: Not on file  . Number of children: Not on file  . Years of education: Not on file  . Highest education level: Not on file  Occupational History  . Not on file  Social Needs  . Financial resource strain: Not on file  . Food insecurity:    Worry: Not on file    Inability: Not on file  . Transportation needs:    Medical: Not on file    Non-medical: Not on file  Tobacco Use  . Smoking status: Never Smoker  . Smokeless tobacco: Never Used  Substance and Sexual Activity  . Alcohol use: Yes    Alcohol/week: 0.0 oz    Comment: rare  . Drug use: No  . Sexual activity: Yes  Lifestyle  . Physical activity:    Days per week: Not on file    Minutes per session: Not on file  . Stress: Not on file  Relationships  . Social connections:    Talks on phone: Not on file    Gets together: Not on file    Attends religious service: Not on file    Active member of club or organization: Not on file    Attends meetings of clubs or organizations: Not on file    Relationship status: Not on file  . Intimate partner violence:    Fear of  current or ex partner: Not on file    Emotionally abused: Not on file    Physically abused: Not on file    Forced sexual activity: Not on file  Other Topics Concern  . Not on file  Social History Narrative  . Not on file     Constitutional: Denies fever, malaise, fatigue, headache or abrupt weight changes.  Gastrointestinal: Denies abdominal pain, bloating, constipation, diarrhea or blood in the stool.  GU: Denies urgency, frequency, pain with urination, burning sensation, blood in urine, odor or discharge. Skin:  Pt reports vaginal irritation. Denies rashes, lesions or ulcercations.    No other specific complaints in a complete review of systems (except as listed in HPI above).  Objective:   Physical Exam  BP 118/78   Pulse 81   Temp 98.7 F (37.1 C) (Oral)   Wt 147 lb (66.7 kg)   LMP 05/31/2017 (Approximate)   SpO2 98%   BMI 24.46 kg/m  Wt Readings from Last 3 Encounters:  06/24/17 147 lb (66.7 kg)  03/04/17 140 lb 1.9 oz (63.6 kg)  07/14/15 125 lb (56.7 kg)  General: Appears her stated age, well developed, well nourished in NAD. Skin: Warm, dry and intact. No rashes, lesions or ulcerations noted. Abdomen: Soft and nontender. Normal bowel sounds.  Pelvic: Normal female anatomy. Labial swelling with thin, white discharge noted.  BMET    Component Value Date/Time   NA 139 07/14/2015 0933   K 4.0 07/14/2015 0933   CL 105 07/14/2015 0933   CO2 28 07/14/2015 0933   GLUCOSE 81 07/14/2015 0933   BUN 11 07/14/2015 0933   CREATININE 0.68 07/14/2015 0933   CALCIUM 9.8 07/14/2015 0933   GFRNONAA >60 07/04/2015 0512   GFRAA >60 07/04/2015 0512    Lipid Panel     Component Value Date/Time   CHOL 124 04/13/2014 1549   TRIG 60.0 04/13/2014 1549   HDL 46.70 04/13/2014 1549   CHOLHDL 3 04/13/2014 1549   VLDL 12.0 04/13/2014 1549   LDLCALC 65 04/13/2014 1549    CBC    Component Value Date/Time   WBC 5.2 07/14/2015 0933   RBC 3.99 07/14/2015 0933   HGB 11.4  (L) 07/14/2015 0933   HCT 34.4 (L) 07/14/2015 0933   PLT 527.0 (H) 07/14/2015 0933   MCV 86.1 07/14/2015 0933   MCH 29.1 07/05/2015 0540   MCHC 33.3 07/14/2015 0933   RDW 13.6 07/14/2015 0933   LYMPHSABS 0.4 (L) 06/28/2015 1045   MONOABS 0.4 06/28/2015 1045   EOSABS 0.1 06/28/2015 1045   BASOSABS 0.0 06/28/2015 1045    Hgb A1C No results found for: HGBA1C         Assessment & Plan:   Vaginal Irritation secondary to Candida and BV:  Wet prep: + yeast, + clue cells eRx for Diflucan 150 mg PO x 1 eRx for Metrogel intravaginally BID x 5 days  Return precautions discussed Nicki Reaper, NP

## 2018-02-27 ENCOUNTER — Encounter (HOSPITAL_COMMUNITY): Payer: Self-pay

## 2018-02-27 ENCOUNTER — Other Ambulatory Visit: Payer: Self-pay

## 2018-02-27 ENCOUNTER — Emergency Department (HOSPITAL_COMMUNITY)
Admission: EM | Admit: 2018-02-27 | Discharge: 2018-02-28 | Disposition: A | Discharge status: Home / Self Care | Payer: BC Managed Care – PPO | Attending: Emergency Medicine | Admitting: Emergency Medicine

## 2018-02-27 ENCOUNTER — Emergency Department (HOSPITAL_COMMUNITY): Payer: BC Managed Care – PPO

## 2018-02-27 DIAGNOSIS — N23 Unspecified renal colic: Secondary | ICD-10-CM

## 2018-02-27 DIAGNOSIS — N1330 Unspecified hydronephrosis: Secondary | ICD-10-CM | POA: Diagnosis not present

## 2018-02-27 DIAGNOSIS — N200 Calculus of kidney: Secondary | ICD-10-CM | POA: Insufficient documentation

## 2018-02-27 DIAGNOSIS — N201 Calculus of ureter: Secondary | ICD-10-CM | POA: Insufficient documentation

## 2018-02-27 DIAGNOSIS — R1031 Right lower quadrant pain: Secondary | ICD-10-CM | POA: Diagnosis present

## 2018-02-27 DIAGNOSIS — Z79899 Other long term (current) drug therapy: Secondary | ICD-10-CM | POA: Diagnosis not present

## 2018-02-27 LAB — URINALYSIS, ROUTINE W REFLEX MICROSCOPIC
Bilirubin Urine: NEGATIVE
Glucose, UA: NEGATIVE mg/dL
Ketones, ur: 5 mg/dL — AB
NITRITE: POSITIVE — AB
PH: 8 (ref 5.0–8.0)
Protein, ur: NEGATIVE mg/dL
SPECIFIC GRAVITY, URINE: 1.012 (ref 1.005–1.030)

## 2018-02-27 LAB — I-STAT BETA HCG BLOOD, ED (MC, WL, AP ONLY)

## 2018-02-27 LAB — PREGNANCY, URINE: PREG TEST UR: NEGATIVE

## 2018-02-27 MED ORDER — HYDROMORPHONE HCL 1 MG/ML IJ SOLN
1.0000 mg | Freq: Once | INTRAMUSCULAR | Status: AC
  Administered 2018-02-27: 1 mg via INTRAVENOUS
  Filled 2018-02-27: qty 1

## 2018-02-27 MED ORDER — HYDROMORPHONE HCL 1 MG/ML IJ SOLN
0.5000 mg | Freq: Once | INTRAMUSCULAR | Status: AC
  Administered 2018-02-27: 0.5 mg via INTRAVENOUS
  Filled 2018-02-27: qty 1

## 2018-02-27 MED ORDER — SODIUM CHLORIDE 0.9 % IV BOLUS
500.0000 mL | Freq: Once | INTRAVENOUS | Status: AC
  Administered 2018-02-27: 500 mL via INTRAVENOUS

## 2018-02-27 MED ORDER — LIDOCAINE HCL 2 % IJ SOLN
1.0000 mg/kg | Freq: Once | INTRAMUSCULAR | Status: AC
  Administered 2018-02-27: 68 mg via INTRAVENOUS
  Filled 2018-02-27: qty 3.4

## 2018-02-27 MED ORDER — ONDANSETRON HCL 4 MG/2ML IJ SOLN
4.0000 mg | Freq: Once | INTRAMUSCULAR | Status: AC
  Administered 2018-02-27: 4 mg via INTRAVENOUS
  Filled 2018-02-27: qty 2

## 2018-02-27 MED ORDER — KETOROLAC TROMETHAMINE 30 MG/ML IJ SOLN
30.0000 mg | Freq: Once | INTRAMUSCULAR | Status: AC
  Administered 2018-02-27: 30 mg via INTRAVENOUS
  Filled 2018-02-27: qty 1

## 2018-02-27 MED ORDER — SODIUM CHLORIDE 0.9 % IV SOLN
1.0000 g | Freq: Once | INTRAVENOUS | Status: AC
  Administered 2018-02-27: 1 g via INTRAVENOUS
  Filled 2018-02-27: qty 10

## 2018-02-27 NOTE — ED Notes (Signed)
Pt. Made aware for the need of urine specimen. 

## 2018-02-27 NOTE — ED Triage Notes (Addendum)
Pt arrives by POV due to right sided flank pain. Pt has a hx of kidney stones. Pt was hospitalized related to sepsis from her kidney stone in 2017.

## 2018-02-27 NOTE — Discharge Instructions (Addendum)
It was our pleasure to provide your ER care today - we hope that you feel better.  Drink plenty of fluids. Strain urine.   Take motrin or aleve as need for pain. You may also take percocet as need for pain. No driving for the next 6 hours or when taking percocet. Also, do not take tylenol or acetaminophen containing medication when taking percocet.   Take keflex as prescribed for possible urine infection. ake zofran as need for nausea.   Follow up with urologist in the next few days if symptoms fail to improve/resolve. Your ct scan shows a right sided 7 mm kidney stone at the UVJ (just above bladder), as well as bilateral kidney stones.   Return to ER if worse, new symptoms, fevers, persistent vomiting, intractable pain, other concern.

## 2018-02-27 NOTE — ED Provider Notes (Addendum)
[facility]-EMERGENCY DEPT Provider Note   CSN: [id number] Arrival date & time: 02/27/18  2049    History   Chief Complaint Chief Complaint  Patient presents with  . Flank Pain    HPI Courtney [name] is a 43 y.o. female.     Patient c/o acute onset right flank pain posteriorly, onset today. Pain mod-severe, constant, dull, persistent, radiates to right lower abd. Similar to prior kidney stone pain. +nausea. One episode emesis, not bloody or bilious. No abd distension. Having normal bms. Last period 1 month ago. Denies vaginal bleeding or discharge. No dysuria or hematuria. No fever or chills.   The history is provided by the patient.  Flank Pain  Pertinent negatives include no chest pain, no abdominal pain, no headaches and no shortness of breath.    History reviewed. No pertinent past medical history.  There are no active problems to display for this patient.   Past Surgical History:  Procedure Laterality Date  . IR GENERIC HISTORICAL  08/01/2015   IR NEPHRO TUBE REMOV/FL 08/01/2015 [facility]  . TUBAL LIGATION  10/29/2004     OB History   No obstetric history on file.      Home Medications    Prior to Admission medications   Medication Sig Start Date End Date Taking? Authorizing Provider  metroNIDAZOLE (METROGEL VAGINAL) 0.75 % vaginal gel Place 1 Applicatorful vaginally 2 (two) times daily. 06/24/17   Lorre MunroeBaity, Regina W, NP    Family History Family History  Problem Relation Age of Onset  . Cancer Mother        Breast  . Lung disease Father     Social History Social History   Tobacco Use  . Smoking status: Never Smoker  . Smokeless tobacco: Never Used  Substance Use Topics  . Alcohol use: Yes    Alcohol/week: 0.0 standard drinks    Comment: rare  . Drug use: No     Allergies   Patient has no known allergies.   Review of Systems Review of Systems  Constitutional: Negative for chills and fever.  HENT: Negative for  sore throat.   Eyes: Negative for redness.  Respiratory: Negative for shortness of breath.   Cardiovascular: Negative for chest pain.  Gastrointestinal: Positive for nausea and vomiting. Negative for abdominal pain.  Endocrine: Negative for polyuria.  Genitourinary: Positive for flank pain. Negative for dysuria, vaginal bleeding and vaginal discharge.  Musculoskeletal: Negative for neck pain.  Skin: Negative for rash.  Neurological: Negative for headaches.  Hematological: Does not bruise/bleed easily.  Psychiatric/Behavioral: Negative for confusion.     Physical Exam Updated Vital Signs BP (!) 134/107 (BP Location: Left Arm)   Pulse 72   Temp 98.2 F (36.8 C) (Oral)   Resp 20   Ht 1.651 m (5\' 5" )   Wt 67.1 kg   SpO2 99%   BMI 24.63 kg/m   Physical Exam Vitals signs and nursing note reviewed.  Constitutional:      Appearance: Normal appearance. She is well-developed.  HENT:     Head: Atraumatic.     Nose: Nose normal.     Mouth/Throat:     Mouth: Mucous membranes are moist.  Eyes:     General: No scleral icterus.    Conjunctiva/sclera: Conjunctivae normal.     Pupils: Pupils are equal, round, and reactive to light.  Neck:     Musculoskeletal: Normal range of motion and neck supple. No neck rigidity or muscular tenderness.  Trachea: No tracheal deviation.  Cardiovascular:     Rate and Rhythm: Normal rate and regular rhythm.     Pulses: Normal pulses.     Heart sounds: Normal heart sounds. No murmur. No friction rub. No gallop.   Pulmonary:     Effort: Pulmonary effort is normal. No respiratory distress.     Breath sounds: Normal breath sounds.  Abdominal:     General: Bowel sounds are normal. There is no distension.     Palpations: Abdomen is soft. There is no mass.     Tenderness: There is no abdominal tenderness. There is no guarding or rebound.     Hernia: No hernia is present.  Genitourinary:    Comments: No cva tenderness.  Musculoskeletal:         General: No swelling.  Skin:    General: Skin is warm and dry.     Findings: No rash.  Neurological:     Mental Status: She is alert.     Comments: Alert, speech normal.   Psychiatric:        Mood and Affect: Mood normal.      ED Treatments / Results  Labs (all labs ordered are listed, but only abnormal results are displayed) Results for orders placed or performed during the hospital encounter of 02/27/18  Pregnancy, urine  Result Value Ref Range   Preg Test, Ur NEGATIVE NEGATIVE  Urinalysis, Routine w reflex microscopic  Result Value Ref Range   Color, Urine YELLOW YELLOW   APPearance CLEAR CLEAR   Specific Gravity, Urine 1.012 1.005 - 1.030   pH 8.0 5.0 - 8.0   Glucose, UA NEGATIVE NEGATIVE mg/dL   Hgb urine dipstick MODERATE (A) NEGATIVE   Bilirubin Urine NEGATIVE NEGATIVE   Ketones, ur 5 (A) NEGATIVE mg/dL   Protein, ur NEGATIVE NEGATIVE mg/dL   Nitrite POSITIVE (A) NEGATIVE   Leukocytes,Ua TRACE (A) NEGATIVE   RBC / HPF 21-50 0 - 5 RBC/hpf   WBC, UA 11-20 0 - 5 WBC/hpf   Bacteria, UA MANY (A) NONE SEEN   Squamous Epithelial / LPF 0-5 0 - 5   Mucus PRESENT   I-Stat Beta hCG blood, ED ([facility], [facility], [facility] only)  Result Value Ref Range   I-stat hCG, quantitative <5.0 <5 mIU/mL   Comment 3           Ct Renal Stone Study  Result Date: 02/27/2018 CLINICAL DATA:  Flank pain right-sided EXAM: CT ABDOMEN AND PELVIS WITHOUT CONTRAST TECHNIQUE: Multidetector CT imaging of the abdomen and pelvis was performed following the standard protocol without IV contrast. COMPARISON:  07/19/2015, 06/26/2015 FINDINGS: Lower chest: Lung bases demonstrate no acute consolidation or effusion. Small hiatal hernia. Hepatobiliary: No focal liver abnormality is seen. No gallstones, gallbladder wall thickening, or biliary dilatation. Pancreas: Unremarkable. No pancreatic ductal dilatation or surrounding inflammatory changes. Spleen: Normal in size without focal abnormality. Adrenals/Urinary Tract: Adrenal  glands are normal. Punctate nonobstructing stone upper pole left kidney. 4 mm stone lower pole right kidney. Moderate right hydronephrosis and hydroureter, secondary to a 7 mm stone at the right UVJ. Distended urinary bladder. Stomach/Bowel: Stomach is within normal limits. Appendix appears normal. No evidence of bowel wall thickening, distention, or inflammatory changes. Vascular/Lymphatic: Nonaneurysmal aorta. No significantly enlarged lymph nodes. Reproductive: Uterus and bilateral adnexa are unremarkable. Other: Negative for free air or free fluid. Musculoskeletal: No acute or significant osseous findings. IMPRESSION: 1. Moderate right hydronephrosis and hydroureter, secondary to a 7 mm stone at the right UVJ. 2.  Bilateral intrarenal calculi Electronically Signed   By: Jasmine Pang M.D.   On: 02/27/2018 22:27    EKG None  Radiology Ct Renal Stone Study  Result Date: 02/27/2018 CLINICAL DATA:  Flank pain right-sided EXAM: CT ABDOMEN AND PELVIS WITHOUT CONTRAST TECHNIQUE: Multidetector CT imaging of the abdomen and pelvis was performed following the standard protocol without IV contrast. COMPARISON:  07/19/2015, 06/26/2015 FINDINGS: Lower chest: Lung bases demonstrate no acute consolidation or effusion. Small hiatal hernia. Hepatobiliary: No focal liver abnormality is seen. No gallstones, gallbladder wall thickening, or biliary dilatation. Pancreas: Unremarkable. No pancreatic ductal dilatation or surrounding inflammatory changes. Spleen: Normal in size without focal abnormality. Adrenals/Urinary Tract: Adrenal glands are normal. Punctate nonobstructing stone upper pole left kidney. 4 mm stone lower pole right kidney. Moderate right hydronephrosis and hydroureter, secondary to a 7 mm stone at the right UVJ. Distended urinary bladder. Stomach/Bowel: Stomach is within normal limits. Appendix appears normal. No evidence of bowel wall thickening, distention, or inflammatory changes. Vascular/Lymphatic:  Nonaneurysmal aorta. No significantly enlarged lymph nodes. Reproductive: Uterus and bilateral adnexa are unremarkable. Other: Negative for free air or free fluid. Musculoskeletal: No acute or significant osseous findings. IMPRESSION: 1. Moderate right hydronephrosis and hydroureter, secondary to a 7 mm stone at the right UVJ. 2. Bilateral intrarenal calculi Electronically Signed   By: Jasmine Pang M.D.   On: 02/27/2018 22:27    Procedures Procedures (including critical care time)  Medications Ordered in ED Medications  HYDROmorphone (DILAUDID) injection 0.5 mg (has no administration in time range)  ondansetron (ZOFRAN) injection 4 mg (has no administration in time range)  sodium chloride 0.9 % bolus 500 mL (has no administration in time range)     Initial Impression / Assessment and Plan / ED Course  I have reviewed the triage vital signs and the nursing notes.  Pertinent labs & imaging results that were available during my care of the patient were reviewed by me and considered in my medical decision making (see chart for details).  Iv ns. Dilaudid iv. zofran iv. Ua/labs. Ct.  Reviewed nursing notes and prior charts for additional history.   Lab reviewed - preg neg.  Ct reviewed - right ureteral stone at uvj.   Pain and nausea persists. toradol 30 mg iv. Dilaudid 1 mg iv. zofran iv.   Recheck pain persists.   Will try lidocaine iv for pain improvement.   Discussed ct w pt.   ua returns with many rbc, 11-20 wbc and many bacteria - no dysuria, no fever, no cva tenderness. Will tx w abx for possible uti. cx sent.   Pt indicates her pharmacy is closed, requests paper rx so can go to 24 hr pharm.   Afebrile. No distress. Vitals normal.   Pt appears stable for d/c.      Final Clinical Impressions(s) / ED Diagnoses   Final diagnoses:  None    ED Discharge Orders    None           Cathren Laine, MD 02/28/18 0002

## 2018-02-28 MED ORDER — CEPHALEXIN 500 MG PO CAPS: 500.0000 mg | ORAL_CAPSULE | Freq: Four times a day (QID) | ORAL | 0 refills | Status: DC

## 2018-02-28 MED ORDER — ONDANSETRON 8 MG PO TBDP: 8.0000 mg | ORAL_TABLET | Freq: Three times a day (TID) | ORAL | 0 refills | Status: DC | PRN

## 2018-02-28 MED ORDER — OXYCODONE-ACETAMINOPHEN 5-325 MG PO TABS: 1.0000 | ORAL_TABLET | Freq: Four times a day (QID) | ORAL | 0 refills | Status: DC | PRN

## 2018-03-02 LAB — URINE CULTURE

## 2018-03-03 ENCOUNTER — Telehealth: Payer: Self-pay

## 2018-03-03 NOTE — Telephone Encounter (Signed)
Post ED Visit - Positive Culture Follow-up  Culture report reviewed by antimicrobial stewardship pharmacist: [facility] []  Enzo Bi, Pharm.D. []  Celedonio Miyamoto, Pharm.D., BCPS AQ-ID []  Garvin Fila, Pharm.D., BCPS []  Georgina Pillion, Pharm.D., BCPS []  [place], [address].D., BCPS, AAHIVP []  Estella Husk, Pharm.D., BCPS, AAHIVP []  Lysle Pearl, PharmD, BCPS []  Phillips Climes, PharmD, BCPS []  Agapito Games, PharmD, BCPS []  Verlan Friends, PharmD []  Mervyn Gay, PharmD, BCPS []  Vinnie Level, PharmD  [facility] []  Len Childs, PharmD []  Greer Pickerel, PharmD []  Adalberto Cole, PharmD []  Perlie Gold, Rph []  Lonell Face) Jean Rosenthal, PharmD []  Earl Many, PharmD []  Junita Push, PharmD []  Dorna Leitz, PharmD []  Terrilee Files, PharmD []  Lynann Beaver, PharmD []  Keturah Barre, PharmD []  Loralee Pacas, PharmD [x]  Bernadene Person, PharmD   Positive urine culture Treated with Cephalexin, organism sensitive to the same and no further patient follow-up is required at this time.  Jerry Caras 03/03/2018, 1:17 PM

## 2020-10-25 ENCOUNTER — Ambulatory Visit: Payer: BC Managed Care – PPO | Admitting: Physician Assistant

## 2020-10-31 ENCOUNTER — Encounter: Payer: BC Managed Care – PPO | Admitting: Physician Assistant

## 2021-01-11 ENCOUNTER — Encounter: Payer: BC Managed Care – PPO | Admitting: Physician Assistant

## 2021-03-16 ENCOUNTER — Ambulatory Visit: Payer: BC Managed Care – PPO | Admitting: Physician Assistant

## 2021-03-16 ENCOUNTER — Encounter: Payer: Self-pay | Admitting: Physician Assistant

## 2021-03-16 VITALS — BP 117/71 | HR 80 | Temp 98.0°F | Ht 65.0 in | Wt 156.4 lb

## 2021-03-16 DIAGNOSIS — Z803 Family history of malignant neoplasm of breast: Secondary | ICD-10-CM | POA: Diagnosis not present

## 2021-03-16 NOTE — Progress Notes (Signed)
? ?Subjective:  ? ? Patient ID: Courtney [name], female    DOB: 15-Nov-1975, 46 y.o.   MRN: [id number] ? ?Chief Complaint  ?Patient presents with  ? Establish Care  ? ? ?HPI ?46 y.o. patient presents today for new patient establishment with me.  Patient was previously established with Courtney Silversmith, NP. ? ?Current Care Team: ?No current specialists  ? ?Acute Concerns: ?-No urgent concerns, stating she is just needing to establish with PCP. She is not up to date on female care or labs. She has not had a mammogram and would like to get this scheduled. Mother has hx breast cancer diagnosed in her 27s.  ? ? ?Past Medical History:  ?Diagnosis Date  ? Renal stone   ? hospitalized 2/2 sepsis  ? ? ?Past Surgical History:  ?Procedure Laterality Date  ? IR GENERIC HISTORICAL  08/01/2015  ? IR NEPHRO TUBE REMOV/FL 08/01/2015 [facility]  ? TUBAL LIGATION  10/29/2004  ? ? ?Family History  ?Problem Relation Age of Onset  ? Cancer Mother   ?     Breast  ? Lung disease Father   ?     emphysema  ? ? ?Social History  ? ?Tobacco Use  ? Smoking status: Never  ? Smokeless tobacco: Never  ?Substance Use Topics  ? Alcohol use: Yes  ?  Alcohol/week: 0.0 standard drinks  ?  Comment: rare  ? Drug use: No  ?  ? ?No Known Allergies ? ?Review of Systems ?NEGATIVE UNLESS OTHERWISE INDICATED IN HPI ? ? ?   ?Objective:  ?  ? ?BP 117/71   Pulse 80   Temp 98 ?F (36.7 ?C)   Ht 5\' 5"  (1.651 m)   Wt 156 lb 6.1 oz (70.9 kg)   LMP 02/09/2021   SpO2 98%   BMI 26.02 kg/m?  ? ?Wt Readings from Last 3 Encounters:  ?03/16/21 156 lb 6.1 oz (70.9 kg)  ?02/27/18 148 lb (67.1 kg)  ?06/24/17 147 lb (66.7 kg)  ? ? ?BP Readings from Last 3 Encounters:  ?03/16/21 117/71  ?02/28/18 124/68  ?06/24/17 118/78  ?  ? ?Physical Exam ?Vitals and nursing note reviewed.  ?Constitutional:   ?   Appearance: Normal appearance. She is normal weight. She is not toxic-appearing.  ?HENT:  ?   Head: Normocephalic and atraumatic.  ?   Right Ear: External ear normal.  ?   Left  Ear: External ear normal.  ?   Nose: Nose normal.  ?   Mouth/Throat:  ?   Mouth: Mucous membranes are moist.  ?Eyes:  ?   Extraocular Movements: Extraocular movements intact.  ?   Conjunctiva/sclera: Conjunctivae normal.  ?   Pupils: Pupils are equal, round, and reactive to light.  ?Cardiovascular:  ?   Rate and Rhythm: Normal rate and regular rhythm.  ?   Pulses: Normal pulses.  ?   Heart sounds: Normal heart sounds.  ?Pulmonary:  ?   Effort: Pulmonary effort is normal.  ?   Breath sounds: Normal breath sounds.  ?Musculoskeletal:     ?   General: Normal range of motion.  ?   Cervical back: Normal range of motion and neck supple.  ?Skin: ?   General: Skin is warm and dry.  ?Neurological:  ?   General: No focal deficit present.  ?   Mental Status: She is alert and oriented to person, place, and time.  ?Psychiatric:     ?   Mood and Affect: Mood normal.     ?  Behavior: Behavior normal.     ?   Thought Content: Thought content normal.     ?   Judgment: Judgment normal.  ? ? ?   ?Assessment & Plan:  ? ?Problem List Items Addressed This Visit   ?None ?Visit Diagnoses   ? ? Family history of breast cancer in mother    -  Primary  ? Relevant Orders  ? MM 3D SCREEN BREAST BILATERAL  ? ?  ? ? ?Family history of breast cancer in mother - Plan: MM 3D SCREEN BREAST BILATERAL ? ?-New patient est, healthy 46 yo female, plan for screening mammogram as this has not been done yet.  ?-Will schedule for well woman and fasting labs this year at her convenience.  ? ? ?Quana Chamberlain M Kelci Petrella, PA-C ?

## 2021-04-17 ENCOUNTER — Encounter: Payer: Self-pay | Admitting: Physician Assistant

## 2021-04-17 ENCOUNTER — Other Ambulatory Visit (HOSPITAL_COMMUNITY)
Admission: RE | Admit: 2021-04-17 | Discharge: 2021-04-17 | Disposition: A | Discharge status: Home / Self Care | Payer: BC Managed Care – PPO | Source: Ambulatory Visit | Attending: Physician Assistant | Admitting: Physician Assistant

## 2021-04-17 ENCOUNTER — Ambulatory Visit: Payer: BC Managed Care – PPO | Admitting: Physician Assistant

## 2021-04-17 VITALS — BP 125/79 | HR 70 | Temp 98.0°F | Ht 65.0 in | Wt 153.6 lb

## 2021-04-17 DIAGNOSIS — Z789 Other specified health status: Secondary | ICD-10-CM

## 2021-04-17 DIAGNOSIS — Z131 Encounter for screening for diabetes mellitus: Secondary | ICD-10-CM

## 2021-04-17 DIAGNOSIS — Z13220 Encounter for screening for lipoid disorders: Secondary | ICD-10-CM | POA: Diagnosis not present

## 2021-04-17 DIAGNOSIS — Z01419 Encounter for gynecological examination (general) (routine) without abnormal findings: Secondary | ICD-10-CM

## 2021-04-17 DIAGNOSIS — Z1239 Encounter for other screening for malignant neoplasm of breast: Secondary | ICD-10-CM

## 2021-04-17 LAB — LIPID PANEL
Cholesterol: 162 mg/dL (ref 0–200)
HDL: 56.1 mg/dL (ref >39.00)
LDL Cholesterol: 92 mg/dL (ref 0–99)
NonHDL: 105.88
Total CHOL/HDL Ratio: 3
Triglycerides: 70 mg/dL (ref 0.0–149.0)
VLDL: 14 mg/dL (ref 0.0–40.0)

## 2021-04-17 LAB — CBC WITH DIFFERENTIAL/PLATELET
Basophils Absolute: 0 10*3/uL (ref 0.0–0.1)
Basophils Relative: 0.6 % (ref 0.0–3.0)
Eosinophils Absolute: 0.1 10*3/uL (ref 0.0–0.7)
Eosinophils Relative: 2.1 % (ref 0.0–5.0)
HCT: 39.1 % (ref 36.0–46.0)
Hemoglobin: 13.1 g/dL (ref 12.0–15.0)
Lymphocytes Relative: 23.3 % (ref 12.0–46.0)
Lymphs Abs: 1.2 10*3/uL (ref 0.7–4.0)
MCHC: 33.5 g/dL (ref 30.0–36.0)
MCV: 87.2 fl (ref 78.0–100.0)
Monocytes Absolute: 0.3 10*3/uL (ref 0.1–1.0)
Monocytes Relative: 6 % (ref 3.0–12.0)
Neutro Abs: 3.4 10*3/uL (ref 1.4–7.7)
Neutrophils Relative %: 68 % (ref 43.0–77.0)
Platelets: 183 10*3/uL (ref 150.0–400.0)
RBC: 4.48 Mil/uL (ref 3.87–5.11)
RDW: 13.2 % (ref 11.5–15.5)
WBC: 5 10*3/uL (ref 4.0–10.5)

## 2021-04-17 LAB — COMPREHENSIVE METABOLIC PANEL
ALT: 10 U/L (ref 0–35)
AST: 12 U/L (ref 0–37)
Albumin: 4.5 g/dL (ref 3.5–5.2)
Alkaline Phosphatase: 48 U/L (ref 39–117)
BUN: 8 mg/dL (ref 6–23)
CO2: 28 mEq/L (ref 19–32)
Calcium: 9.4 mg/dL (ref 8.4–10.5)
Chloride: 105 mEq/L (ref 96–112)
Creatinine, Ser: 0.73 mg/dL (ref 0.40–1.20)
GFR: 100.17 mL/min (ref >60.00)
Glucose, Bld: 82 mg/dL (ref 70–99)
Potassium: 3.8 mEq/L (ref 3.5–5.1)
Sodium: 140 mEq/L (ref 135–145)
Total Bilirubin: 1.3 mg/dL — ABNORMAL HIGH (ref 0.2–1.2)
Total Protein: 6.8 g/dL (ref 6.0–8.3)

## 2021-04-17 LAB — VITAMIN D 25 HYDROXY (VIT D DEFICIENCY, FRACTURES): VITD: 12.66 ng/mL — ABNORMAL LOW (ref 30.00–100.00)

## 2021-04-17 LAB — VITAMIN B12: Vitamin B-12: 150 pg/mL — ABNORMAL LOW (ref 211–911)

## 2021-04-17 NOTE — Patient Instructions (Addendum)
Please call the [facility] to schedule your mammogram. [phone] ? ?Fasting labs today, will call with results. ? ?Keep working on health goals. Your BMI, weight, vitals, all look great!! Keep up good work!  ?

## 2021-04-17 NOTE — Progress Notes (Signed)
? ?Subjective:  ? ? Patient ID: Courtney [name], female    DOB: 1975-07-23, 46 y.o.   MRN: [id number] ? ?Chief Complaint  ?Patient presents with  ? Gynecologic Exam  ?  Would like to discuss Vit D levels  ? ? ?Gynecologic Exam ? ?Patient is in today for well woman exam.  She is fasting today.  Last menstrual period it was 04/03/2021.  Periods are regular.  She is not up-to-date on Pap smear (?2017 per patient) or mammogram.  She has not had a mammogram and her mother has history of breast cancer diagnosed in her 103s.  Patient is vegetarian.  Exercises regularly.  No other concerns today. ? ?Past Medical History:  ?Diagnosis Date  ? Renal stone   ? hospitalized 2/2 sepsis  ? ? ?Past Surgical History:  ?Procedure Laterality Date  ? IR GENERIC HISTORICAL  08/01/2015  ? IR NEPHRO TUBE REMOV/FL 08/01/2015 [facility]  ? TUBAL LIGATION  10/29/2004  ? ? ?Family History  ?Problem Relation Age of Onset  ? Cancer Mother   ?     Breast  ? Lung disease Father   ?     emphysema  ? ? ?Social History  ? ?Tobacco Use  ? Smoking status: Never  ? Smokeless tobacco: Never  ?Substance Use Topics  ? Alcohol use: Yes  ?  Alcohol/week: 0.0 standard drinks  ?  Comment: rare  ? Drug use: No  ?  ? ?No Known Allergies ? ?Review of Systems ?NEGATIVE UNLESS OTHERWISE INDICATED IN HPI ? ? ?   ?Objective:  ?  ? ?BP 125/79   Pulse 70   Temp 98 ?F (36.7 ?C) (Temporal)   Ht 5\' 5"  (1.651 m)   Wt 153 lb 9.6 oz (69.7 kg)   LMP 04/03/2021 (Approximate)   SpO2 100%   BMI 25.56 kg/m?  ? ?Wt Readings from Last 3 Encounters:  ?04/17/21 153 lb 9.6 oz (69.7 kg)  ?03/16/21 156 lb 6.1 oz (70.9 kg)  ?02/27/18 148 lb (67.1 kg)  ? ? ?BP Readings from Last 3 Encounters:  ?04/17/21 125/79  ?03/16/21 117/71  ?02/28/18 124/68  ?  ? ?Physical Exam ?Vitals and nursing note reviewed. Exam conducted with a chaperone present.  ?Constitutional:   ?   Appearance: Normal appearance. She is normal weight. She is not toxic-appearing.  ?HENT:  ?   Head: Normocephalic and  atraumatic.  ?   Right Ear: Tympanic membrane, ear canal and external ear normal.  ?   Left Ear: Tympanic membrane, ear canal and external ear normal.  ?   Nose: Nose normal.  ?   Mouth/Throat:  ?   Mouth: Mucous membranes are moist.  ?Eyes:  ?   Extraocular Movements: Extraocular movements intact.  ?   Conjunctiva/sclera: Conjunctivae normal.  ?   Pupils: Pupils are equal, round, and reactive to light.  ?Cardiovascular:  ?   Rate and Rhythm: Normal rate and regular rhythm.  ?   Pulses: Normal pulses.  ?   Heart sounds: Normal heart sounds.  ?Pulmonary:  ?   Effort: Pulmonary effort is normal.  ?   Breath sounds: Normal breath sounds.  ?Chest:  ?Breasts: ?   Right: Normal. No inverted nipple, mass or nipple discharge.  ?   Left: Normal. No inverted nipple or nipple discharge.  ?Abdominal:  ?   General: Abdomen is flat. Bowel sounds are normal.  ?   Palpations: Abdomen is soft.  ?Genitourinary: ?   General: Normal vulva.  ?  Vagina: Normal.  ?   Cervix: Normal.  ?   Uterus: Normal.   ?   Adnexa: Right adnexa normal and left adnexa normal.  ?Musculoskeletal:     ?   General: Normal range of motion.  ?   Cervical back: Normal range of motion and neck supple.  ?Skin: ?   General: Skin is warm and dry.  ?Neurological:  ?   General: No focal deficit present.  ?   Mental Status: She is alert and oriented to person, place, and time.  ?Psychiatric:     ?   Mood and Affect: Mood normal.     ?   Behavior: Behavior normal.     ?   Thought Content: Thought content normal.     ?   Judgment: Judgment normal.  ? ? ?   ?Assessment & Plan:  ? ?Problem List Items Addressed This Visit   ?None ?Visit Diagnoses   ? ? Well woman exam with routine gynecological exam    -  Primary  ? Relevant Orders  ? Cytology - PAP( [facility])  ? CBC with Differential/Platelet  ? Comprehensive metabolic panel  ? Lipid panel  ? Diabetes mellitus screening      ? Relevant Orders  ? Comprehensive metabolic panel  ? Screening for cholesterol level      ?  Relevant Orders  ? Lipid panel  ? Encounter for screening for malignant neoplasm of breast, unspecified screening modality      ? Relevant Orders  ? MM 3D SCREEN BREAST BILATERAL  ? Vegetarian diet      ? Relevant Orders  ? Iron, TIBC and Ferritin Panel  ? Vitamin D (25 hydroxy)  ? Vitamin B12  ? ?  ? ? ?PLAN ?Age-appropriate screening and counseling performed today. Will check labs and call with results. Preventive measures discussed and printed in AVS for patient.  ?-Pap performed in office ?-Schedule for mammogram ?-Colonoscopy next year after turning 52 ?-Consider Tdap update ?-Doing well with overall diet and exercise  ? ?F/up 1 year or prn  ? ?Brihany Butch M Tuere Nwosu, PA-C ?

## 2021-04-18 ENCOUNTER — Other Ambulatory Visit: Payer: Self-pay

## 2021-04-18 ENCOUNTER — Other Ambulatory Visit: Payer: Self-pay | Admitting: *Deleted

## 2021-04-18 DIAGNOSIS — E559 Vitamin D deficiency, unspecified: Secondary | ICD-10-CM

## 2021-04-18 LAB — IRON,TIBC AND FERRITIN PANEL
%SAT: 27 % (calc) (ref 16–45)
Ferritin: 13 ng/mL — ABNORMAL LOW (ref 16–232)
Iron: 97 ug/dL (ref 40–190)
TIBC: 359 mcg/dL (calc) (ref 250–450)

## 2021-04-18 MED ORDER — VITAMIN D (ERGOCALCIFEROL) 1.25 MG (50000 UNIT) PO CAPS: 50000.0000 [IU] | ORAL_CAPSULE | ORAL | 1 refills | Status: AC

## 2021-04-18 NOTE — Progress Notes (Signed)
Rx was already sent

## 2021-04-19 LAB — CYTOLOGY - PAP
Comment: NEGATIVE
Diagnosis: UNDETERMINED — AB
High risk HPV: NEGATIVE

## 2021-04-20 ENCOUNTER — Other Ambulatory Visit: Payer: Self-pay

## 2021-04-20 DIAGNOSIS — R87620 Atypical squamous cells of undetermined significance on cytologic smear of vagina (ASC-US): Secondary | ICD-10-CM

## 2021-04-27 ENCOUNTER — Ambulatory Visit: Payer: BC Managed Care – PPO

## 2021-05-02 ENCOUNTER — Ambulatory Visit
Admission: RE | Admit: 2021-05-02 | Discharge: 2021-05-02 | Disposition: A | Discharge status: Home / Self Care | Payer: BC Managed Care – PPO | Source: Ambulatory Visit | Attending: Physician Assistant | Admitting: Physician Assistant

## 2021-05-02 DIAGNOSIS — Z1239 Encounter for other screening for malignant neoplasm of breast: Secondary | ICD-10-CM

## 2021-05-04 ENCOUNTER — Other Ambulatory Visit: Payer: Self-pay

## 2021-05-04 ENCOUNTER — Other Ambulatory Visit: Payer: Self-pay | Admitting: Physician Assistant

## 2021-05-04 DIAGNOSIS — R928 Other abnormal and inconclusive findings on diagnostic imaging of breast: Secondary | ICD-10-CM

## 2021-05-08 ENCOUNTER — Ambulatory Visit: Payer: BC Managed Care – PPO | Admitting: Obstetrics and Gynecology

## 2021-05-08 ENCOUNTER — Encounter: Payer: Self-pay | Admitting: Obstetrics and Gynecology

## 2021-05-08 ENCOUNTER — Ambulatory Visit: Payer: Self-pay | Admitting: Obstetrics and Gynecology

## 2021-05-08 VITALS — BP 118/74 | HR 74 | Ht 65.0 in | Wt 153.0 lb

## 2021-05-08 DIAGNOSIS — R87610 Atypical squamous cells of undetermined significance on cytologic smear of cervix (ASC-US): Secondary | ICD-10-CM

## 2021-05-08 NOTE — Progress Notes (Signed)
46 y.o. G71P4 Married White or Caucasian Hispanic or Latino female here for evaluation of an abnormal pap smear. 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  ?Her last pap prior to 4/23 was on 03/24/14. That pap was negative with negative hpv testing.  ?No h/o cervical dysplasia.  ? ?Period Cycle (Days): 28 ?Period Duration (Days): 5 ?Period Pattern: Regular ?Menstrual Flow: Moderate ?Menstrual Control: Maxi pad, Tampon ?Dysmenorrhea: (!) Mild ?Dysmenorrhea Symptoms: Cramping ? ?Patient's last menstrual period was 04/19/2021.          ?Sexually active: Yes.    ?The current method of family planning is tubal ligation.    ?Exercising: Yes.    Home exercise routine includes walking 1 hrs per day. ?Smoker:  no ? ?Health Maintenance: ?Pap:  04/17/2021 atypical squamous cell ?History of abnormal Pap:  no ?MMG:  05/02/2021 ?BMD:   n/a ?Colonoscopy: n/a ?TDaP: couple years ago per pt unsure of date ?Gardasil: n/a ? ? reports that she has never smoked. She has never used smokeless tobacco. She reports current alcohol use. She reports that she does not use drugs. ? ?Past Medical History:  ?Diagnosis Date  ? Renal stone   ? hospitalized 2/2 sepsis  ? ? ?Past Surgical History:  ?Procedure Laterality Date  ? IR GENERIC HISTORICAL  08/01/2015  ? IR NEPHRO TUBE REMOV/FL 08/01/2015 [facility]  ? TUBAL LIGATION  10/29/2004  ? ? ?Current Outpatient Medications  ?Medication Sig Dispense Refill  ? Cyanocobalamin (VITAMIN B-12 PO) Take 1 tablet by mouth daily.    ? Multiple Vitamin (MULTIVITAMIN) capsule Take 1 capsule by mouth daily.    ? Vitamin D, Ergocalciferol, (DRISDOL) 1.25 MG (50000 UNIT) CAPS capsule Take 1 capsule (50,000 Units total) by mouth every 7 (seven) days. 12 capsule 1  ? ?No current facility-administered medications for this visit.  ? ? ?Family History  ?Problem Relation Age of Onset  ? Cancer Mother   ?     Breast  ? Lung disease Father   ?     emphysema  ? ? ?Review of  Systems ? ?Exam:   ?BP 118/74 (BP Location: Right Arm, Patient Position: Sitting, Cuff Size: Normal)   Pulse 74   Ht 5\' 5"  (1.651 m)   Wt 153 lb (69.4 kg)   LMP 04/19/2021   BMI 25.46 kg/m?   Weight change: @WEIGHTCHANGE @ Height:   Height: 5\' 5"  (165.1 cm)  ?Ht Readings from Last 3 Encounters:  ?05/08/21 5\' 5"  (1.651 m)  ?04/17/21 5\' 5"  (1.651 m)  ?03/16/21 5\' 5"  (1.651 m)  ? ? ?General appearance: alert, cooperative and appears stated age ? ?1. 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 Prior pap was in 3/16 and was negative with negative hpv testing. We discussed that with her history, an ASCUS pap with negative hpv testing is essentially a variant of normal.  ?-Reviewed her pap results ?-Her next pap is due in 3 years with hpv testing.  ? ?

## 2021-05-19 ENCOUNTER — Ambulatory Visit
Admission: RE | Admit: 2021-05-19 | Discharge: 2021-05-19 | Disposition: A | Discharge status: Home / Self Care | Payer: BC Managed Care – PPO | Source: Ambulatory Visit | Attending: Physician Assistant | Admitting: Physician Assistant

## 2021-05-19 DIAGNOSIS — R928 Other abnormal and inconclusive findings on diagnostic imaging of breast: Secondary | ICD-10-CM

## 2021-12-07 ENCOUNTER — Encounter: Payer: Self-pay | Admitting: Internal Medicine

## 2021-12-07 ENCOUNTER — Ambulatory Visit: Payer: BC Managed Care – PPO | Admitting: Internal Medicine

## 2021-12-07 VITALS — BP 130/81 | HR 79 | Temp 98.1°F | Resp 12 | Ht 65.0 in | Wt 154.4 lb

## 2021-12-07 DIAGNOSIS — Z1383 Encounter for screening for respiratory disorder NEC: Secondary | ICD-10-CM

## 2021-12-07 DIAGNOSIS — J029 Acute pharyngitis, unspecified: Secondary | ICD-10-CM | POA: Diagnosis not present

## 2021-12-07 DIAGNOSIS — J020 Streptococcal pharyngitis: Secondary | ICD-10-CM

## 2021-12-07 DIAGNOSIS — R519 Headache, unspecified: Secondary | ICD-10-CM

## 2021-12-07 DIAGNOSIS — R051 Acute cough: Secondary | ICD-10-CM

## 2021-12-07 DIAGNOSIS — J210 Acute bronchiolitis due to respiratory syncytial virus: Secondary | ICD-10-CM | POA: Diagnosis not present

## 2021-12-07 LAB — POC COVID19 BINAXNOW: SARS Coronavirus 2 Ag: NEGATIVE

## 2021-12-07 LAB — POCT RAPID STREP A (OFFICE): Rapid Strep A Screen: POSITIVE — AB

## 2021-12-07 MED ORDER — FLUTICASONE PROPIONATE 50 MCG/ACT NA SUSP: 2.0000 | Freq: Every day | NASAL | 6 refills | Status: AC

## 2021-12-07 MED ORDER — PSEUDOEPHEDRINE HCL ER 120 MG PO TB12: 120.0000 mg | ORAL_TABLET | Freq: Two times a day (BID) | ORAL | 0 refills | Status: AC

## 2021-12-07 MED ORDER — SIMPLY SALINE 0.9 % NA AERS: 2.0000 | INHALATION_SPRAY | Freq: Every day | NASAL | 1 refills | Status: AC | PRN

## 2021-12-07 MED ORDER — AMOXICILLIN 500 MG PO CAPS
500.0000 mg | ORAL_CAPSULE | Freq: Two times a day (BID) | ORAL | 0 refills | Status: AC
Start: 2021-12-07 — End: 2021-12-17

## 2021-12-07 MED ORDER — LORATADINE 10 MG PO TABS: 10.0000 mg | ORAL_TABLET | Freq: Every day | ORAL | 11 refills | Status: AC

## 2021-12-07 NOTE — Patient Instructions (Addendum)
It was a pleasure seeing you today!  Your health and satisfaction are my top priorities. If you believe your experience today was worthy of a 5-star rating, I'd be grateful for your feedback! Lula Olszewski, MD      CLINICAL PLAN REMINDERS: Your checklist to help you remember today's clinical plan     I recommend rest.  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  Also my recommended self-care regimen is Robitussin cough and cold complete, sinus rinses with simply saline, Flonase after the sinus rinses, Sudafed for nasal congestion that persist despite all of the above and also Claritin can help some even though this is not an allergy problem with keeping congestion down.  Also it seems you have strep throat so I have written a 10-day course of antibiotic to clear that up do not be surprised if you are not all better in a few days because I still think you have RSV as well.  Considering I am not expecting a speedy recovery I recommend having plenty of popsicles ice cream and chicken noodle soup on hand    If you are not doing well:  Return to the office sooner  Please bring all your medicines to each appointment If your condition begins to worsen or become severe:  GO to the ER    (Optional):  Review your clinical notes on MyChart after they are completed.     Today's draft of the physician documented plan for today's visit: (final revisions will be visible on MyChart chart later) RSV (acute bronchiolitis due to respiratory syncytial virus) -     POCT respiratory syncytial virus -     Fluticasone Propionate; Place 2 sprays into both nostrils daily.  Dispense: 16 g; Refill: 6 -     Simply Saline; Place 2 each into the nose daily as needed.  Dispense: 500 mL; Refill: 1 -     Loratadine; Take 1 tablet (10 mg total) by mouth daily.  Dispense: 30  tablet; Refill: 11 -     Pseudoephedrine HCl ER; Take 1 tablet (120 mg total) by mouth 2 (two) times daily.  Dispense: 20 tablet; Refill: 0  Screening for respiratory condition -     POCT respiratory syncytial virus  Strep pharyngitis -     Amoxicillin; Take 1 capsule (500 mg total) by mouth 2 (two) times daily for 10 days.  Dispense: 20 capsule; Refill: 0     Strep Throat, Adult Strep throat is an infection in the throat that is caused by bacteria. It is common during the cold months of the year. It mostly affects children who are 43-96 years old. However, people of all ages can get it at any time of the year. This infection spreads from person to person (is contagious) through coughing, sneezing, or having close contact. Your health care provider may use other names to describe the infection. When strep throat affects the tonsils, it is called tonsillitis. When it affects the back of the throat, it is called pharyngitis. What are the causes? This condition is caused by the Streptococcus pyogenes bacteria. What increases the risk? You are more likely to develop this condition if: You care for school-age children, or are around school-age children. Children are more likely to get strep throat and may spread it to others.  You spend time in crowded places where the infection can spread easily. You have close contact with someone who has strep throat. What are the signs or symptoms? Symptoms of this condition include: Fever or chills. Redness, swelling, or pain in the tonsils or throat. Pain or difficulty when swallowing. White or yellow spots on the tonsils or throat. Tender glands in the neck and under the jaw. Bad smelling breath. Red rash all over the body. This is rare. How is this diagnosed? This condition is diagnosed by tests that check for the presence and the amount of bacteria that cause strep throat. They are: Rapid strep test. Your throat is swabbed and checked for the  presence of bacteria. Results are usually ready in minutes. Throat culture test. Your throat is swabbed. The sample is placed in a cup that allows infections to grow. Results are usually ready in 1 or 2 days. How is this treated? This condition may be treated with: Medicines that kill germs (antibiotics). Medicines that relieve pain or fever. These include: Ibuprofen or acetaminophen. Aspirin, only for people who are over the age of 44. Throat lozenges. Throat sprays. Follow these instructions at home: Medicines  Take over-the-counter and prescription medicines only as told by your health care provider. Take your antibiotic medicine as told by your health care provider. Do not stop taking the antibiotic even if you start to feel better. Eating and drinking  If you have trouble swallowing, try eating soft foods until your sore throat feels better. Drink enough fluid to keep your urine pale yellow. To help relieve pain, you may have: Warm fluids, such as soup and tea. Cold fluids, such as frozen desserts or popsicles. General instructions Gargle with a salt-water mixture 3-4 times a day or as needed. To make a salt-water mixture, completely dissolve -1 tsp (3-6 g) of salt in 1 cup (237 mL) of warm water. Get plenty of rest. Stay home from work or school until you have been taking antibiotics for 24 hours. Do not use any products that contain nicotine or tobacco. These products include cigarettes, chewing tobacco, and vaping devices, such as e-cigarettes. If you need help quitting, ask your health care provider. It is up to you to get your test results. Ask your health care provider, or the department that is doing the test, when your results will be ready. Keep all follow-up visits. This is important. How is this prevented?  Do not share food, drinking cups, or personal items that could cause the infection to spread to other people. Wash your hands often with soap and water for at  least 20 seconds. If soap and water are not available, use hand sanitizer. Make sure that all people in your house wash their hands well. Have family members tested if they have a sore throat or fever. They may need an antibiotic if they have strep throat. Contact a health care provider if: You have swelling in your neck that keeps getting bigger. You develop a rash, cough, or earache. You cough up a thick mucus that is green, yellow-brown, or bloody. You have pain or discomfort that does not get better with medicine. Your symptoms seem to be getting worse. You have a fever. Get help right away if: You have new symptoms, such as vomiting, severe headache, stiff or painful neck, chest pain, or shortness of breath. You have severe throat pain, drooling, or changes in your voice. You have swelling of the neck, or the skin on the neck becomes  red and tender. You have signs of dehydration, such as tiredness (fatigue), dry mouth, and decreased urination. You become increasingly sleepy, or you cannot wake up completely. Your joints become red or painful. These symptoms may represent a serious problem that is an emergency. Do not wait to see if the symptoms will go away. Get medical help right away. Call your local emergency services (911 in the [place] Do not drive yourself to the hospital. Summary Strep throat is an infection in the throat that is caused by the Streptococcus pyogenes bacteria. This infection is spread from person to person (is contagious) through coughing, sneezing, or having close contact. Take your medicines, including antibiotics, as told by your health care provider. Do not stop taking the antibiotic even if you start to feel better. To prevent the spread of germs, wash your hands well with soap and water. Have others do the same. Do not share food, drinking cups, or personal items. Get help right away if you have new symptoms, such as vomiting, severe headache, stiff or painful  neck, chest pain, or shortness of breath. This information is not intended to replace advice given to you by your health care provider. Make sure you discuss any questions you have with your health care provider. Document Revised: 04/19/2020 Document Reviewed: 04/19/2020 Elsevier Patient Education  2023 [employer]     Respiratory Syncytial Virus Infection, Adult Respiratory syncytial virus (RSV) infection is an infection caused by RSV, a common virus. This virus is similar to viruses that cause the common cold and the flu. RSV infection can affect the nose, throat, windpipe, and lungs (respiratory system). When the infection is severe, it can cause: Bronchiolitis. This condition causes inflammation of the air passages in the lungs (bronchioles). Pneumonia. This condition causes inflammation of the air sacs in the lungs. RSV infection spreads from person to person (is contagious) through droplets from coughs and sneezes (respiratory secretions). This condition is rarely serious when it occurs in adults. What are the causes? This condition is caused by contact with RSV. This can happen by: Breathing respiratory secretions from someone who has the infection. Touching something that has been exposed to the virus (is contaminated) and then touching your mouth, nose, or eyes. Coming in close contact with someone who has this infection. This may happen if you: Hug or kiss. Shake or hold hands. Eat or drink using the same dishes or utensils. What increases the risk? The following factors may make you more likely to develop this condition: Being 2 years of age or older. Having certain health conditions, including: A long-term (chronic) lung condition, such as chronic obstructive pulmonary disease (COPD). An immune system that is weak. This is your body's defense system. Down syndrome. Heart disease. Working in a hospital or other health care facility. Living in a long-term health care  facility. RSV infections are most common from the months of November to April, but they can happen any time of year. What are the signs or symptoms? Symptoms of this condition include: Having a runny nose. Coughing. You may have a cough that brings up mucus (productive cough). Sneezing. Having a fever. Wanting to eat less than usual. Breathing loudly (wheezing). Having shortness of breath. Having fluid build up in the lungs (respiratory distress). How is this diagnosed? This condition may be diagnosed based on: Your symptoms. Your medical history. A physical exam. A chest X-ray to rule out pneumonia. Blood tests or tests of mucus from your lungs (sputum). These tests may be  done for older adults. A test of a sample of your respiratory secretions. How is this treated? In most cases, the RSV infection will go away after 1-2 weeks of caring for yourself at home.  Sometimes, RSV infection is severe and can cause bronchiolitis or pneumonia. If you develop one or both of these conditions, you may need to be treated in the hospital. You may be given: Oxygen therapy. Antiviral medicine. Medicines to open your bronchioles (bronchodilators). Follow these instructions at home: Medicines Take over-the-counter and prescription medicines only as told by your health care provider. If you were prescribed an antiviral medicine, take it as told by your health care provider. Do not stop using the antiviral even if you start to feel better. Lifestyle  Eat a healthy diet. Do not drink alcohol. Do not use any products that contain nicotine or tobacco, such as cigarettes, e-cigarettes, and chewing tobacco. If you need help quitting, ask your health care provider. Rest at home until your symptoms go away. Return to your normal activities as told by your health care provider. Ask your health care provider what activities are safe for you. General instructions  Drink enough fluid to keep your urine pale  yellow. Gargle with a salt-water mixture 3-4 times a day or as needed. To make a salt-water mixture, completely dissolve -1 tsp (3-6 g) of salt in 1 cup (237 mL) of warm water. Keep all follow-up visits as told by your health care provider. This is important. How is this prevented? To prevent catching and spreading RSV: Wash your hands often with soap and water for at least 20 seconds. If soap and water are not available, use hand sanitizer. Do not touch your face without first cleaning your hands. Stay home if you have symptoms of the common cold or the flu. Cover your nose and mouth when you cough or sneeze. Avoid large groups of people. Keep a safe distance of about 6 feet (1.8 m) from people who are coughing or sneezing. Where to find more information Centers for Disease Control and Prevention: [employer] Contact a health care provider if: Your symptoms get worse or have not changed after 2 weeks. You have: A fever. Hot flashes, sweating, or chills that keep happening. A cough that brings up much more mucus than usual. A cough that brings up blood. You feel: Very tired (lethargic). Confused. Get help right away if: You have increased or severe trouble breathing. You lose consciousness. These symptoms may represent a serious problem that is an emergency. Do not wait to see if the symptoms will go away. Get medical help right away. Call your local emergency services (911 in the [place] Do not drive yourself to the hospital. Summary Respiratory syncytial virus (RSV) infection is an infection caused by RSV, a common virus. RSV infection can affect the nose, throat, windpipe, and lungs (respiratory system). When the infection is severe, it can cause bronchiolitis or pneumonia. Take over-the-counter and prescription medicines only as told by your health care provider. Contact a health care provider if your symptoms get worse or have not changed after 2 weeks. This information is not  intended to replace advice given to you by your health care provider. Make sure you discuss any questions you have with your health care provider. Document Revised: 10/08/2018 Document Reviewed: 10/15/2018 Elsevier Patient Education  2022 [employer]   Sinusitis and associated upper respiratory infections: My recommended self-care regimen is Robitussin cough and cold complete, 2-10x daily nasal sinus rinses with SimplySaline,  Flonase (or similar) after the sinus rinses, Sudafed for nasal congestion that persists despite all of the above and also Claritin can help some even though this is not an allergy problem with keeping congestion down, which reduces post nasal dripping.  Reduced post nasal drip can help with cough and sore throat.  Chicken noodle soup, popsicles, and lozenges are also very soothing to the throat.   A sinus infection is soreness and swelling (inflammation) of your sinuses. Sinuses are hollow spaces in the bones around your face. They are located: Around your eyes. In the middle of your forehead. Behind your nose. In your cheekbones. Your sinuses and nasal passages are lined with a fluid called mucus. Mucus drains out of your sinuses. Swelling can trap mucus in your sinuses. This lets germs (bacteria, virus, or fungus) grow, which leads to infection. Most of the time, this condition is caused by a virus.  Chronic allergies can increase swelling in the passages of the sinuses, reducing drainage of mucous and causing infections. What are the causes? Allergies. Asthma. Germs. Things that block your nose or sinuses. Growths in the nose (nasal polyps). Chemicals or irritants in the air. A fungus. This is rare. What increases the risk? Having a weak body defense system (immune system). Doing a lot of swimming or diving. Using nasal sprays too much. Smoking. What are the signs or symptoms? The main symptoms of this condition are pain and a feeling of pressure around the  sinuses. Other symptoms include: Stuffy nose (congestion). This may make it hard to breathe through your nose. Runny nose (drainage). Soreness, swelling, and warmth in the sinuses. A cough that may get worse at night. Being unable to smell and taste. Mucus that collects in the throat or the back of the nose (postnasal drip). This may cause a sore throat or bad breath. Being very tired (fatigued). A fever. How is this diagnosed? Your symptoms. Your medical history. A physical exam. Tests to find out if your condition is short-term (acute) or long-term (chronic). Your doctor may: Check your nose for growths (polyps). Check your sinuses using a tool that has a light on one end (endoscope). Check for allergies or germs. Do imaging tests, such as an MRI or CT scan. How is this treated? Treatment for this condition depends on the cause and whether it is short-term or long-term. If caused by a virus, your symptoms should go away on their own within 10 days. You may be given medicines to relieve symptoms. They include: Medicines that shrink swollen tissue in the nose. A spray that treats swelling of the nostrils. Rinses that help get rid of thick mucus in your nose (nasal saline washes). Medicines that treat allergies (antihistamines). Over-the-counter pain relievers. If caused by bacteria, your doctor may wait to see if you will get better without treatment. You may be given antibiotic medicine if you have: A very bad infection. A weak body defense system. If caused by growths in the nose, surgery may be needed. Follow these instructions at home: Medicines Take, use, or apply over-the-counter and prescription medicines only as told by your doctor. These may include nasal sprays. If you were prescribed an antibiotic medicine, take it as told by your doctor. Do not stop taking it even if you start to feel better. Hydrate and humidify  Drink enough water to keep your pee (urine) pale  yellow. Use a cool mist humidifier to keep the humidity level in your home above 50%. Breathe in steam for  10-15 minutes, 3-4 times a day, or as told by your doctor. You can do this in the bathroom while a hot shower is running. Try not to spend time in cool or dry air. Decongest and rinse out your sinuses with daily SimplySaline gentle misting spray- apply any medicated sinus sprays after using this misting to blow your nose. Rest Rest as much as you can. Sleep with your head raised (elevated). Make sure you get enough sleep each night. General instructions  Put a warm, moist washcloth on your face 3-4 times a day, or as often as told by your doctor. Use nasal saline washes as often as told by your doctor. Wash your hands often with soap and water. If you cannot use soap and water, use hand sanitizer. Do not smoke. Avoid being around people who are smoking (secondhand smoke). Keep all follow-up visits. Contact a doctor if: You have a fever. Your symptoms get worse. Your symptoms do not get better within 10 days. Get help right away if: You have a very bad headache. You cannot stop vomiting. You have very bad pain or swelling around your face or eyes. You have trouble seeing. You feel confused. Your neck is stiff. You have trouble breathing. These symptoms may be an emergency. Get help right away. Call 911. Do not wait to see if the symptoms will go away. Do not drive yourself to the hospital. Summary A sinus infection is swelling of your sinuses. Sinuses are hollow spaces in the bones around your face. This condition is caused by tissues in your nose that become inflamed or swollen. This traps germs. These can lead to infection. Daily sinus rinsing can reduce your risk for these. If you were prescribed an antibiotic medicine, take it as told by your doctor. Do not stop taking it even if you start to feel better. Keep all follow-up visits.     QUESTIONS & CONCERNS: CLINICAL:  please contact me via phone [phone] OR MyChart messaging  LAB & IMAGING:   We will call you if the results are significantly abnormal or you don't use MyChart.  Most normal results will be posted to MyChart immediately and have a clinical review message by Dr. Jon Billings posted within 2-3 business days.   If you have not heard from [place] regarding the results in 2 weeks OR if you need priority reporting, please contact this office. MYCHART:  The fastest way to get your results and easiest way to stay in touch with [place] is by activating your My Chart account. Instructions are located on the last page of this paperwork.  BILLING: xray and lab orders are billed from separate companies and questions./concerns should be directed to the invoicing company.  For visit charges please discuss with our administrative services COMPLAINTS:  please let Dr. Jon Billings know or see the [facility] Practice Administrator - Burnett Kanaris, by asking at the front desk: we want you to be satisfied with every experience and we would be grateful for the opportunity to address any problems

## 2021-12-07 NOTE — Progress Notes (Signed)
[facility]: [phone]   Routine Medical Office Visit  Patient:  Courtney [name]      Age: 46 y.o.       Sex:  female  Date:   12/07/2021  PCP:    Bary Leriche, PA-C   Today's Healthcare Provider: Lula Olszewski, MD  Assessment/Plan:   Given RSV exposure and range of symptom(s) I think this is RSV mainly.. but we don't have the rsv test and the strep test came strongly positive   Courtney [name] was seen today for sore throat, cough, exposure to rsv, hoarse, generalized body aches and medication refill.  RSV (acute bronchiolitis due to respiratory syncytial virus) -     Fluticasone Propionate; Place 2 sprays into both nostrils daily.  Dispense: 16 g; Refill: 6 -     Simply Saline; Place 2 each into the nose daily as needed.  Dispense: 500 mL; Refill: 1 -     Loratadine; Take 1 tablet (10 mg total) by mouth daily.  Dispense: 30 tablet; Refill: 11 -     Pseudoephedrine HCl ER; Take 1 tablet (120 mg total) by mouth 2 (two) times daily.  Dispense: 20 tablet; Refill: 0  Screening for respiratory condition  Strep pharyngitis -     Amoxicillin; Take 1 capsule (500 mg total) by mouth 2 (two) times daily for 10 days.  Dispense: 20 capsule; Refill: 0  Acute cough -     POC COVID-19 BinaxNow  Acute nonintractable headache, unspecified headache type  Sore throat -     POCT rapid strep A    Today's key discussion points and After Visit Summary (AVS) reminders.  She was encouraged to contact our office by phone or message via MyChart if she has any questions or concerns regarding our treatment plan (see AVS). She was given an opportunity to ask questions/clarifications about any aspect of the diagnosis and treatment plan at today's visit. Common side effects, risks, benefits, and alternatives for medications and treatment plan prescribed today were discussed. She expressed understanding of the given instructions.  We discussed red flag symptoms (shortness of  breath) and signs in detail and when to call the office or go to ER if her condition worsens (see AVS). She expressed understanding and AVS is used to reinforce. No barriers to understanding were identified     Subjective:   Courtney [name] is a 46 y.o. female with past medical history including: Past Medical History:  Diagnosis Date   Renal stone    hospitalized 2/2 sepsis    She presented today reporting reason for visit as: Chief Complaint  Patient presents with   Sore Throat    Symptoms started on Saturday (worse at night), lymph nodes are sore.   Cough    Producing greenish mucus with some tinge of blood sometimes.   Exposure to RSV    From her grandchild.   Hoarse    Started on Tuesday.   Generalized Body Aches    Has taken Aleve and Mucinex, with some temporary relief.   Medication Refill    Vitamin D.    First body aching 5 D ago 3 days ago lost her voice  Close exposure grandson with proven RSV right now coughing up on everyone. Patient denies fevers,  Initially had myalgias but now just severe pain along right anterior cervical chain where there is lad Patient denies nausea or vomiting Patient denies diarrhea or constipation Patient denies shortness of breath Works as Runner, broadcasting/film/video  Objective:  Physical Exam: BP 130/81 (BP Location: Left Arm, Patient Position: Sitting)   Pulse 79   Temp 98.1 F (36.7 C) (Temporal)   Resp 12   Ht 5\' 5"  (1.651 m)   Wt 154 lb 6.4 oz (70 kg)   SpO2 99%   BMI 25.69 kg/m   Physical Exam  Problem-specific physical exam findings:  Enflamed oropharynx and tonsils Lost voice almost entirely Ears clear No diff breathing Lungs/heart sounds normal. Tender lad anterior right cervical chain with sternocleidomastoid muscle tenderness  Results Review Results for orders placed or performed in visit on 12/07/21  POC COVID-19  Result Value Ref Range   SARS Coronavirus 2 Ag Negative Negative  POCT rapid strep A  Result Value  Ref Range   Rapid Strep A Screen Positive (A) Negative   Strep test "strongly positive" per cma     12/09/21, MD

## 2022-10-10 NOTE — Progress Notes (Shared)
Courtney [name] is a 47 y.o. female here for a new problem.  History of Present Illness:   No chief complaint on file.   HPI  Urinary Incontinence    Past Medical History:  Diagnosis Date   Renal stone    hospitalized 2/2 sepsis     Social History   Tobacco Use   Smoking status: Never   Smokeless tobacco: Never  Substance Use Topics   Alcohol use: Yes    Alcohol/week: 0.0 standard drinks of alcohol    Comment: rare   Drug use: No    Past Surgical History:  Procedure Laterality Date   IR GENERIC HISTORICAL  08/01/2015   IR NEPHRO TUBE REMOV/FL 08/01/2015 [facility]   TUBAL LIGATION  10/29/2004    Family History  Problem Relation Age of Onset   Breast cancer Mother 15   Lung disease Father        emphysema    No Known Allergies  Current Medications:   Current Outpatient Medications:    Cyanocobalamin (VITAMIN B-12 PO), Take 1 tablet by mouth daily., Disp: , Rfl:    fluticasone (FLONASE) 50 MCG/ACT nasal spray, Place 2 sprays into both nostrils daily., Disp: 16 g, Rfl: 6   loratadine (CLARITIN) 10 MG tablet, Take 1 tablet (10 mg total) by mouth daily., Disp: 30 tablet, Rfl: 11   Multiple Vitamin (MULTIVITAMIN) capsule, Take 1 capsule by mouth daily., Disp: , Rfl:    pseudoephedrine (SUDAFED 12 HOUR) 120 MG 12 hr tablet, Take 1 tablet (120 mg total) by mouth 2 (two) times daily., Disp: 20 tablet, Rfl: 0   Saline (SIMPLY SALINE) 0.9 % AERS, Place 2 each into the nose daily as needed., Disp: 500 mL, Rfl: 1   Vitamin D, Ergocalciferol, (DRISDOL) 1.25 MG (50000 UNIT) CAPS capsule, Take 1 capsule (50,000 Units total) by mouth every 7 (seven) days., Disp: 12 capsule, Rfl: 1   Review of Systems:   ROS  Vitals:   There were no vitals filed for this visit.   There is no height or weight on file to calculate BMI.  Physical Exam:   Physical Exam  Assessment and Plan:   ***   I,Alexander Ruley,acting as a scribe for Jarold Motto, PA.,have documented  all relevant documentation on the behalf of Jarold Motto, PA,as directed by  Jarold Motto, PA while in the presence of Jarold Motto, [place].   ***   Jarold Motto, PA-C

## 2022-10-17 ENCOUNTER — Ambulatory Visit: Payer: BC Managed Care – PPO | Admitting: Physician Assistant

## 2022-11-08 ENCOUNTER — Ambulatory Visit (INDEPENDENT_AMBULATORY_CARE_PROVIDER_SITE_OTHER): Payer: BC Managed Care – PPO | Admitting: Physician Assistant

## 2022-11-08 ENCOUNTER — Encounter: Payer: Self-pay | Admitting: Physician Assistant

## 2022-11-08 VITALS — BP 112/64 | HR 81 | Temp 97.7°F | Ht 65.0 in | Wt 153.8 lb

## 2022-11-08 DIAGNOSIS — N393 Stress incontinence (female) (male): Secondary | ICD-10-CM | POA: Diagnosis not present

## 2022-11-08 DIAGNOSIS — Z800 Family history of malignant neoplasm of digestive organs: Secondary | ICD-10-CM | POA: Diagnosis not present

## 2022-11-08 DIAGNOSIS — A084 Viral intestinal infection, unspecified: Secondary | ICD-10-CM

## 2022-11-08 DIAGNOSIS — Z1211 Encounter for screening for malignant neoplasm of colon: Secondary | ICD-10-CM | POA: Diagnosis not present

## 2022-11-08 NOTE — Progress Notes (Signed)
Subjective:    Patient ID: Courtney [name], female    DOB: 03/18/1975, 47 y.o.   MRN: [id number]  Chief Complaint  Patient presents with   Urinary Incontinence    Pt states she has weak bladder issues, when sneezing coughing, running jumping etc loses urine. Pt also states been sick the past few days    HPI Discussed the use of AI scribe software for clinical note transcription with the patient, who gave verbal consent to proceed.  History of Present Illness   The patient, a 47 year old with a history of four vaginal deliveries, presents with worsening urinary incontinence. Initially, the incontinence was only during physical exertion such as running or jumping, but it has progressed to occur with coughing or sneezing. The patient reports that the volume of urine loss varies, but at times it is significant enough to require the use of pads. The patient denies nocturia unless she consumes liquids before bed. She also denies any sensation of pelvic organ prolapse. The patient has not previously sought treatment for this issue.  In addition to the urinary incontinence, the patient has been experiencing symptoms of a stomach bug, including inability to keep food down and body aches. The patient reports feeling better today and has been able to consume coconut water and a sandwich. She denies fever, chills, and night sweats.       Past Medical History:  Diagnosis Date   Renal stone    hospitalized 2/2 sepsis    Past Surgical History:  Procedure Laterality Date   IR GENERIC HISTORICAL  08/01/2015   IR NEPHRO TUBE REMOV/FL 08/01/2015 [facility]   TUBAL LIGATION  10/29/2004    Family History  Problem Relation Age of Onset   Breast cancer Mother 68   Colon cancer Mother    Dementia Mother    Lung disease Father        emphysema    Social History   Tobacco Use   Smoking status: Never   Smokeless tobacco: Never  Substance Use Topics   Alcohol use: Yes    Alcohol/week: 0.0  standard drinks of alcohol    Comment: rare   Drug use: No     No Known Allergies  Review of Systems NEGATIVE UNLESS OTHERWISE INDICATED IN HPI      Objective:     BP 112/64 (BP Location: Left Arm)   Pulse 81   Temp 97.7 F (36.5 C) (Temporal)   Ht 5\' 5"  (1.651 m)   Wt 153 lb 12.8 oz (69.8 kg)   SpO2 97%   BMI 25.59 kg/m   Wt Readings from Last 3 Encounters:  11/08/22 153 lb 12.8 oz (69.8 kg)  12/07/21 154 lb 6.4 oz (70 kg)  05/08/21 153 lb (69.4 kg)    BP Readings from Last 3 Encounters:  11/08/22 112/64  12/07/21 130/81  05/08/21 118/74     Physical Exam Vitals and nursing note reviewed.  Constitutional:      Appearance: Normal appearance.  Cardiovascular:     Rate and Rhythm: Normal rate and regular rhythm.     Pulses: Normal pulses.     Heart sounds: Normal heart sounds. No murmur heard. Pulmonary:     Effort: Pulmonary effort is normal.     Breath sounds: Normal breath sounds.  Abdominal:     General: Abdomen is flat. Bowel sounds are normal. There is no distension.     Palpations: Abdomen is soft. There is no mass.  Tenderness: There is no abdominal tenderness. There is no right CVA tenderness or left CVA tenderness.  Neurological:     Mental Status: She is alert.  Psychiatric:        Mood and Affect: Mood normal.        Assessment & Plan:  Stress incontinence of urine -     Ambulatory referral to Physical Therapy  Viral gastroenteritis  Screening for colon cancer  Family history of colon cancer in mother -     Ambulatory referral to Gastroenterology    Assessment and Plan    Stress Urinary Incontinence Increased frequency of urinary leakage with coughing, sneezing, and physical activity. No sensation of prolapse or nocturia. History of four vaginal deliveries. -Refer to pelvic floor physical therapy for evaluation and strengthening exercises.  Acute Gastroenteritis Recent symptoms of vomiting and body aches, improving at the  time of visit. No fever, chills, or night sweats. -Advise to rehydrate and consume simple foods (soup, crackers, toast, yogurt).  General Health Maintenance -Update mammogram (last done May 2023). -Refer for initial colonoscopy due to age (47 years) and family history of colon cancer. -Schedule physical exam in 3-4 months (early 2025) with fasting labs.          Return in about 3 months (around 02/08/2023) for physical, fasting labs .    Monicka Cyran M Ulysess Witz, PA-C

## 2023-01-30 ENCOUNTER — Ambulatory Visit: Payer: Self-pay | Admitting: Family

## 2023-01-30 NOTE — Progress Notes (Deleted)
   Patient ID: Courtney [name], female    DOB: [birth year], 48 y.o.   MRN: [id number]  No chief complaint on file.           Assessment & Plan:   Subjective:    Outpatient Medications Prior to Visit  Medication Sig Dispense Refill   Cyanocobalamin (VITAMIN B-12 PO) Take 1 tablet by mouth daily. (Patient not taking: Reported on 11/08/2022)     fluticasone (FLONASE) 50 MCG/ACT nasal spray Place 2 sprays into both nostrils daily. (Patient not taking: Reported on 11/08/2022) 16 g 6   loratadine (CLARITIN) 10 MG tablet Take 1 tablet (10 mg total) by mouth daily. (Patient not taking: Reported on 11/08/2022) 30 tablet 11   Multiple Vitamin (MULTIVITAMIN) capsule Take 1 capsule by mouth daily. (Patient not taking: Reported on 11/08/2022)     pseudoephedrine (SUDAFED 12 HOUR) 120 MG 12 hr tablet Take 1 tablet (120 mg total) by mouth 2 (two) times daily. (Patient not taking: Reported on 11/08/2022) 20 tablet 0   Saline (SIMPLY SALINE) 0.9 % AERS Place 2 each into the nose daily as needed. (Patient not taking: Reported on 11/08/2022) 500 mL 1   Vitamin D, Ergocalciferol, (DRISDOL) 1.25 MG (50000 UNIT) CAPS capsule Take 1 capsule (50,000 Units total) by mouth every 7 (seven) days. (Patient not taking: Reported on 11/08/2022) 12 capsule 1   No facility-administered medications prior to visit.   Past Medical History:  Diagnosis Date   Renal stone    hospitalized 2/2 sepsis   Past Surgical History:  Procedure Laterality Date   IR GENERIC HISTORICAL  08/01/2015   IR NEPHRO TUBE REMOV/FL 08/01/2015 [facility]   TUBAL LIGATION  10/29/2004   No Known Allergies    Objective:    Physical Exam Vitals and nursing note reviewed.  Constitutional:      Appearance: Normal appearance.  Cardiovascular:     Rate and Rhythm: Normal rate and regular rhythm.  Pulmonary:     Effort: Pulmonary effort is normal.     Breath sounds: Normal breath sounds.  Musculoskeletal:        General: Normal range of  motion.  Skin:    General: Skin is warm and dry.  Neurological:     Mental Status: She is alert.  Psychiatric:        Mood and Affect: Mood normal.        Behavior: Behavior normal.    There were no vitals taken for this visit. Wt Readings from Last 3 Encounters:  11/08/22 153 lb 12.8 oz (69.8 kg)  12/07/21 154 lb 6.4 oz (70 kg)  05/08/21 153 lb (69.4 kg)       Dulce Sellar, NP

## 2023-02-01 ENCOUNTER — Ambulatory Visit (INDEPENDENT_AMBULATORY_CARE_PROVIDER_SITE_OTHER): Payer: 59 | Admitting: Urgent Care

## 2023-02-01 ENCOUNTER — Ambulatory Visit: Payer: Self-pay | Admitting: Physician Assistant

## 2023-02-01 ENCOUNTER — Encounter: Payer: Self-pay | Admitting: Urgent Care

## 2023-02-01 VITALS — BP 133/82 | HR 72 | Wt 157.0 lb

## 2023-02-01 DIAGNOSIS — H0019 Chalazion unspecified eye, unspecified eyelid: Secondary | ICD-10-CM | POA: Diagnosis not present

## 2023-02-01 MED ORDER — ERYTHROMYCIN 5 MG/GM OP OINT: 1.0000 | TOPICAL_OINTMENT | Freq: Three times a day (TID) | OPHTHALMIC | 0 refills | Status: AC

## 2023-02-01 MED ORDER — AMOXICILLIN-POT CLAVULANATE 875-125 MG PO TABS: 1.0000 | ORAL_TABLET | Freq: Two times a day (BID) | ORAL | 0 refills | Status: AC

## 2023-02-01 NOTE — Telephone Encounter (Signed)
Noted, appt today.

## 2023-02-01 NOTE — Telephone Encounter (Signed)
  Chief Complaint: bump on left bottom  eyelid Symptoms: bump, sore, slight swelling Frequency: constant   Disposition: [] ED /[] Urgent Care (no appt availability in office) / [x] Appointment(In office/virtual)/ []  [facility]/ [] Home Care/ [] Refused Recommended Disposition /[] [place] [facility]/ []  Follow-up with PCP Additional Notes: pt complaining of bump on bottom of left eyelid. Pt stated she has been doing warm compresses thinking it was sty, but pt saw school nurse who thought it wasn't. Pt states it's sore and has moved from inside to outside by doing warm compresses. Per protocol, pt to be seen within 3 days. No pcp appts, but pt willing to see provider at [facility] office at 1420 today. RN gave care advice and pt verbalized understanding.          Copied from CRM [id number]. Topic: Clinical - Red Word Triage >> Feb 01, 2023 11:29 AM Steele Sizer wrote: Red Word that prompted transfer to Nurse Triage: Pt stated that she has a bump at the bottom of her eye near her lash, it was on the inside but she was adding warm comfort and it then moved to the outside of her eye. The bump is sore, slight swelling and it is hard to sleep. Reason for Disposition  [1] After 5 days of treatment per [facility] Advice AND [2] not better  Answer Assessment - Initial Assessment Questions 1. LOCATION: "Which eye has the sty?" "Upper or lower eyelid?"     left 2. SIZE: "How big is it?" (Note: standard pencil eraser is 6 mm)     Bigger than pencil point. Maybe size of a pea 3. EYELID: "Is the eyelid swollen?" If Yes, ask: "How much?"     Bottom- little 4. REDNESS: "Has the redness spread onto the eyelid?"     no 5. ONSET: "When did you notice the sty?"     2 weeks  6. VISION: "Do you have blurred vision?"      denies 7. PAIN: "Is it painful?" If Yes, ask: "How bad is the pain?"  (Scale 1-10; or mild, moderate, severe)     mild 8. CONTACTS: "Do you wear contacts?"     no 9. OTHER SYMPTOMS:  "Do you have any other symptoms?" (e.g., fever)     denies  Protocols used: Sty-A-AH

## 2023-02-01 NOTE — Progress Notes (Signed)
Established Patient Office Visit  Subjective:  Patient ID: Courtney [name], female    DOB: [birth year]  Age: 48 y.o. MRN: [id number]  Chief Complaint  Patient presents with  . Stye    Possible stye on left eye. That's been there for 2 weeks. She states it only hurts when she touches it   Discussed the use of AI software for clinical note transcription with the patient who gave verbal consent to proceed.   History of Present Illness The patient presented with a two-week history of a red and swollen lesion on the lower left eyelid. Initially, the patient believed it to be a stye due to its appearance, which included a white, pus-like substance. She states it hurst with direct palpation, otherwise denies any pain to the area. Despite attempts at home remedies, including warm compresses, tea bags, and garlic, the lesion persisted. The patient reported discomfort primarily when closing the eyes, describing a sensation of something being present, but denied any pain or discomfort when moving the eyes. The patient had no prior history of similar symptoms.  The patient had been using castor oil on the eyelids and eyelashes for cosmetic purposes prior to the onset of the lesion. The patient denied any changes to other skincare products. The patient also denied any history of contact lens use. The patient's discomfort was not described as sharp or burning, and there was no discharge or drainage from the lesion. The patient denied any swelling above the eyes or pain in the eyeballs. No fever, discharge or change in vision.   Patient Active Problem List   Diagnosis Date Noted  . Family history of colon cancer in mother 11/08/2022   Past Medical History:  Diagnosis Date  . Renal stone    hospitalized 2/2 sepsis   Past Surgical History:  Procedure Laterality Date  . IR GENERIC HISTORICAL  08/01/2015   IR NEPHRO TUBE REMOV/FL 08/01/2015 [facility]  . TUBAL LIGATION  10/29/2004   Social History    Tobacco Use  . Smoking status: Never  . Smokeless tobacco: Never  Substance Use Topics  . Alcohol use: Yes    Alcohol/week: 0.0 standard drinks of alcohol    Comment: rare  . Drug use: No      ROS: as noted in HPI  Objective:     BP 133/82   Pulse 72   Wt 157 lb (71.2 kg)   SpO2 100%   BMI 26.13 kg/m  BP Readings from Last 3 Encounters:  02/01/23 133/82  11/08/22 112/64  12/07/21 130/81   Wt Readings from Last 3 Encounters:  02/01/23 157 lb (71.2 kg)  11/08/22 153 lb 12.8 oz (69.8 kg)  12/07/21 154 lb 6.4 oz (70 kg)      Physical Exam Vitals and nursing note reviewed.  Constitutional:      General: She is not in acute distress.    Appearance: Normal appearance. She is not ill-appearing, toxic-appearing or diaphoretic.  HENT:     Head: Normocephalic and atraumatic.     Right Ear: External ear normal.     Left Ear: External ear normal.     Mouth/Throat:     Mouth: Mucous membranes are moist.  Eyes:     General: Lids are everted, no foreign bodies appreciated. Vision grossly intact. Gaze aligned appropriately. No allergic shiner, visual field deficit or scleral icterus.       Right eye: No foreign body, discharge or hordeolum.  Left eye: Hordeolum present.No foreign body or discharge.     Extraocular Movements: Extraocular movements intact.     Right eye: Normal extraocular motion and no nystagmus.     Left eye: Normal extraocular motion and no nystagmus.     Conjunctiva/sclera: Conjunctivae normal.     Right eye: Right conjunctiva is not injected. No chemosis, exudate or hemorrhage.    Left eye: Left conjunctiva is not injected. No chemosis, exudate or hemorrhage.    Pupils: Pupils are equal, round, and reactive to light.   Musculoskeletal:     Cervical back: Normal range of motion.  Lymphadenopathy:     Cervical: No cervical adenopathy.  Neurological:     Mental Status: She is alert.     No results found for any visits on 02/01/23.   The  10-year ASCVD risk score (Arnett DK, et al., 2019) is: 0.6%  Assessment & Plan:  External chalazion -     Amoxicillin-Pot Clavulanate; Take 1 tablet by mouth 2 (two) times daily with a meal for 5 days.  Dispense: 10 tablet; Refill: 0 -     Erythromycin; Place 1 Application into the left eye 3 (three) times daily for 7 days. Apply 1 inch ribbon to affected eye TID for 5 days.  Dispense: 3.5 g; Refill: 0   Assessment and Plan Eyelid Swelling Likely external chalazion, possibly secondary to castor oil use. No signs of stye, internal chalazion, or herpes simplex virus infection. No ocular discomfort or visual changes. -Start Augmentin 875mg  twice daily for 5 days. -Apply erythromycin ophthalmic ointment externally on the lid margin for 7-14 days. -Continue warm compresses. -Avoid applying makeup directly on the affected area. -If no improvement in 7-14 days, contact the office for further evaluation.  No follow-ups on file.   Maretta Bees, PA

## 2023-02-01 NOTE — Patient Instructions (Signed)
Please take the antibiotic twice daily with food for five days.  Please continue warm moist compresses over the affected area of the lower left eyelid.  After each compress, use a small amount of topical erythromycin antibiotic ointment. Use this three times daily for 7-10 days, or until resolved.  Do not apply cosmetic products directly to the affected area, unless you plan to discard them after use.  Return to clinic for a recheck if any new symptoms, fever develops, eye pain, drainage or redness of the eye.

## 2023-02-05 ENCOUNTER — Ambulatory Visit: Payer: 59 | Attending: Physician Assistant

## 2023-02-05 ENCOUNTER — Other Ambulatory Visit: Payer: Self-pay

## 2023-02-05 DIAGNOSIS — R279 Unspecified lack of coordination: Secondary | ICD-10-CM | POA: Insufficient documentation

## 2023-02-05 DIAGNOSIS — N393 Stress incontinence (female) (male): Secondary | ICD-10-CM | POA: Diagnosis not present

## 2023-02-05 DIAGNOSIS — M6281 Muscle weakness (generalized): Secondary | ICD-10-CM | POA: Diagnosis present

## 2023-02-05 NOTE — Therapy (Signed)
OUTPATIENT PHYSICAL THERAPY FEMALE PELVIC EVALUATION   Patient Name: Courtney [name] MRN: [id number] DOB:September 05, 1975, 48 y.o., female Today's Date: 02/05/2023  END OF SESSION:  PT End of Session - 02/05/23 1444     Visit Number 1    Date for PT Re-Evaluation 07/23/23    Authorization Type [employer]    PT Start Time 1445    PT Stop Time 1525    PT Time Calculation (min) 40 min    Activity Tolerance Patient tolerated treatment well    Behavior During Therapy [facility] for tasks assessed/performed             Past Medical History:  Diagnosis Date   Renal stone    hospitalized 2/2 sepsis   Past Surgical History:  Procedure Laterality Date   IR GENERIC HISTORICAL  08/01/2015   IR NEPHRO TUBE REMOV/FL 08/01/2015 [facility]   TUBAL LIGATION  10/29/2004   Patient Active Problem List   Diagnosis Date Noted   Family history of colon cancer in mother 11/08/2022    PCP: Allwardt, Crist Infante, PA-C  REFERRING PROVIDER: Allwardt, Crist Infante, PA-C  REFERRING DIAG: N39.3 (ICD-10-CM) - Stress incontinence of urine  THERAPY DIAG:  Muscle weakness (generalized)  Unspecified lack of coordination  Rationale for Evaluation and Treatment: Rehabilitation  ONSET DATE: 6 years  SUBJECTIVE:                                                                                                                                                                                           SUBJECTIVE STATEMENT: Pt states that she has been having issues with her bladder for a while, but it's getting worse. When she runs urine will come out; and now it happens with coughing and sneezing. She does yoga for exercise. Fluid intake: Yes: coffee, 32oz of water    PAIN:  Are you having pain? No   PRECAUTIONS: None  RED FLAGS: None   WEIGHT BEARING RESTRICTIONS: No  FALLS:  Has patient fallen in last 6 months? No  LIVING ENVIRONMENT: Lives with: lives with their family Lives in:  House/apartment   OCCUPATION: kindergarten teacher  PLOF: Independent  PATIENT GOALS: to stop leaking, especially when she has a cold  PERTINENT HISTORY:  G4P4 (all vaginal deliveries), tubal ligation, kidney stone followed by sepsis and surgery  BOWEL MOVEMENT: Pain with bowel movement: No Type of bowel movement:Frequency 1x/day or every other day and Strain No Fully empty rectum: Yes: - Leakage: No Pads: No  Fiber supplement: No  URINATION: Pain with urination: No Fully empty bladder: Yes: - Stream: Strong Urgency: Yes: sometimes right in front of the  toilet Frequency: an hour after cup of coffee; only 1-2x/day when at work; will wake up at night to urinate if she drinks tea or water Leakage: Coughing, Sneezing, and running Pads: No  INTERCOURSE: Pain with intercourse:  none Ability to have vaginal penetration:  Yes: - Climax: WNL Marinoff Scale: 3/3  PREGNANCY: Vaginal deliveries 4 Tearing No C-section deliveries 0 Currently pregnant No  PROLAPSE: none   OBJECTIVE:  Note: Objective measures were completed at Evaluation unless otherwise noted. 02/05/23 COGNITION: Overall cognitive status: Within functional limits for tasks assessed     SENSATION: Light touch: Appears intact Proprioception: Appears intact  FUNCTIONAL TESTS:  Squat: Lt weight shift Single leg stance:  Rt: Lt pelvic drop Lt: Rt pelvic drop Curl-up test:  GAIT: Comments: WNL  POSTURE: rounded shoulders, forward head, and posterior pelvic tilt  LUMBARAROM/PROM:  A/PROM A/PROM  Eval (% available)  Flexion 75   Extension 50  Right lateral flexion 75  Left lateral flexion 75  Right rotation 75  Left rotation 75   (Blank rows = not tested)   PALPATION:   General  abdominal tightness, especially upper abdominals with decreased rib cage mobility                External Perineal Exam WNL                             Internal Pelvic Floor WNL  Patient confirms identification  and approves PT to assess internal pelvic floor and treatment Yes  PELVIC MMT:   MMT eval  Vaginal 2/5, 3 second endurance, 6 repeat contractions  Diastasis Recti No diastasis and good pressure management without distortion  (Blank rows = not tested)        TONE: WNL  PROLAPSE: Grade 3 anterior/Grade 2 posterior vaginal wall laxity  TODAY'S TREATMENT:                                                                                                                              DATE:  02/05/23  EVAL  Neuromuscular re-education: Pt provides verbal consent for internal vaginal/rectal pelvic floor exam. Pelvic floor muscle contraction training with internal vaginal feedback Quick flicks Long holds Therapeutic activities: Urge drill The knack    PATIENT EDUCATION:  Education details: See above Person educated: Patient Education method: Explanation, Demonstration, Tactile cues, Verbal cues, and Handouts Education comprehension: verbalized understanding  HOME EXERCISE PROGRAM: [id number]  ASSESSMENT:  CLINICAL IMPRESSION: Patient is a 48 y.o. female who was seen today for physical therapy evaluation and treatment for urinary incontinence. Exam findings notable for functional core weakness with pelvic drop in single leg stance, abdominal restriction, decreased rib cage mobility, pelvic floor muscle weakness, decreased pelvic floor muscle endurance, and anterior/posterior vaginal wall laxity. Signs and symptoms are most consistent with pelvic floor muscle weakness and poor pressure management. Initial treatment consisted of pelvic floor muscle contraction training, urge drill,  and the knack; we started discussing the importance of good abdominal pressure management and to become more aware of breath holding with yoga activities. She will continue to benefit from skilled PT intervention in order to decrease urinary incontinence, improve pressure management, and begin/progress functional  strengthening program.   OBJECTIVE IMPAIRMENTS: decreased activity tolerance, decreased coordination, decreased endurance, decreased strength, increased fascial restrictions, increased muscle spasms, impaired tone, postural dysfunction, and pain.   ACTIVITY LIMITATIONS: continence  PARTICIPATION LIMITATIONS: community activity and occupation  PERSONAL FACTORS: 1 comorbidity: medical history  are also affecting patient's functional outcome.   REHAB POTENTIAL: Good  CLINICAL DECISION MAKING: Stable/uncomplicated  EVALUATION COMPLEXITY: Low   GOALS: Goals reviewed with patient? Yes  SHORT TERM GOALS: Target date: 03/05/2023    Pt will be independent with HEP.   Baseline: Goal status: INITIAL  2.  Pt will increase pelvic floor muscle strength to 2/5. Baseline:  Goal status: INITIAL  3.  Pt will be able to correctly perform diaphragmatic breathing and appropriate pressure management in order to prevent worsening vaginal wall laxity and improve pelvic floor A/ROM.   Baseline:  Goal status: INITIAL  4.  Pt will be independent with the knack, urge suppression technique, and double voiding in order to improve bladder habits and decrease urinary incontinence.   Baseline:  Goal status: INITIAL    LONG TERM GOALS: Target date: 07/23/23  Pt will be independent with advanced HEP.   Baseline:  Goal status: INITIAL  2.  Pt will increase pelvic floor muscle strength to 4/5. Baseline:  Goal status: INITIAL  3.  Pt will increase pelvic floor muscle endurance to greater than 10 seconds. Baseline:  Goal status: INITIAL  4.  Pt will be able to run without any episodes of leaking. Baseline:  Goal status: INITIAL  5.  Pt will not have any leaking prior to urinating at toilet.  Baseline:  Goal status: INITIAL  6.  Pt will report no leaks with laughing, coughing, sneezing in order to improve comfort with interpersonal relationships and community activities.   Baseline:  Goal  status: INITIAL  PLAN:  PT FREQUENCY: 1-2x/week  PT DURATION: 6 months  PLANNED INTERVENTIONS: 97110-Therapeutic exercises, 97530- Therapeutic activity, [id number]- Neuromuscular re-education, 97535- Self Care, [id number]- Manual therapy, Dry Needling, and Biofeedback  PLAN FOR NEXT SESSION: begin core training.    Julio Alm, PT, DPT01/28/255:34 PM

## 2023-02-05 NOTE — Patient Instructions (Signed)
The knack: Use this technique while coughing, laughing, sneezing, or with any activities that causes you to leak urine a little. Right before you perform one of these activities that increase pressure in the abdomen and pushes a little urine out, perform a pelvic floor muscle contraction and hold. If that does not completely stop the leaking, try tightening your thighs together in addition to performing a pelvic floor muscle contraction. Make sure you are not trying to stifle a cough, sneeze, or laugh; allow these activities in full as it will cause less pressure down into the bladder and pelvic floor muscles.   Urge Incontinence  Ideal urination frequency is every 2-4 wakeful hours, which equates to 5-8 times within a 24-hour period.   Urge incontinence is leakage that occurs when the bladder muscle contracts, creating a sudden need to go before getting to the bathroom.   Going too often when your bladder isn't actually full can disrupt the body's automatic signals to store and hold urine longer, which will increase urgency/frequency.  In this case, the bladder "is running the show" and strategies can be learned to retrain this pattern.   One should be able to control the first urge to urinate, at around .  The bladder can hold up to a "grande latte," or . To help you gain control, practice the Urge Drill below when urgency strikes.  This drill will help retrain your bladder signals and allow you to store and hold urine longer.  The overall goal is to stretch out your time between voids to reach a more manageable voiding schedule.    Practice your "quick flicks" often throughout the day (each waking hour) even when you don't need feel the urge to go.  This will help strengthen your pelvic floor muscles, making them more effective in controlling leakage.  Urge Drill  When you feel an urge to go, follow these steps to regain control: Stop what you are doing and be still Take one  deep breath, directing your air into your abdomen Think an affirming thought, such as "I've got this." Do 5 quick flicks of your pelvic floor Walk with control to the bathroom to void, or delay voiding     [facility] [address] [place], [place] [address] Phone # [phone] Fax [phone]

## 2023-02-12 ENCOUNTER — Ambulatory Visit: Payer: 59 | Attending: Physician Assistant

## 2023-02-12 DIAGNOSIS — M6281 Muscle weakness (generalized): Secondary | ICD-10-CM | POA: Diagnosis present

## 2023-02-12 DIAGNOSIS — R279 Unspecified lack of coordination: Secondary | ICD-10-CM | POA: Diagnosis present

## 2023-02-12 NOTE — Therapy (Signed)
 OUTPATIENT PHYSICAL THERAPY FEMALE PELVIC TREATMENT   Patient Name: Courtney [name] MRN: [id number] DOB:01-Mar-1975, 48 y.o., female Today's Date: 02/12/2023  END OF SESSION:  PT End of Session - 02/12/23 1445     Visit Number 2    Date for PT Re-Evaluation 07/23/23    Authorization Type [employer]    PT Start Time 1445    PT Stop Time 1525    PT Time Calculation (min) 40 min    Activity Tolerance Patient tolerated treatment well    Behavior During Therapy [facility] for tasks assessed/performed             Past Medical History:  Diagnosis Date   Renal stone    hospitalized 2/2 sepsis   Past Surgical History:  Procedure Laterality Date   IR GENERIC HISTORICAL  08/01/2015   IR NEPHRO TUBE REMOV/FL 08/01/2015 [facility]   TUBAL LIGATION  10/29/2004   Patient Active Problem List   Diagnosis Date Noted   Family history of colon cancer in mother 11/08/2022    PCP: Allwardt, Mardy HERO, PA-C  REFERRING PROVIDER: Allwardt, Mardy HERO, PA-C  REFERRING DIAG: N39.3 (ICD-10-CM) - Stress incontinence of urine  THERAPY DIAG:  Muscle weakness (generalized)  Unspecified lack of coordination  Rationale for Evaluation and Treatment: Rehabilitation  ONSET DATE: 6 years  SUBJECTIVE:                                                                                                                                                                                           SUBJECTIVE STATEMENT: Pt states that she needs help clarifying some of the exercises. Pt states that she did not try to do any running or jumping since her first visit, so unsure if she has seen any progress.  PAIN:  Are you having pain? No   PRECAUTIONS: None  RED FLAGS: None   WEIGHT BEARING RESTRICTIONS: No  FALLS:  Has patient fallen in last 6 months? No  LIVING ENVIRONMENT: Lives with: lives with their family Lives in: House/apartment   OCCUPATION: kindergarten teacher  PLOF: Independent  PATIENT  GOALS: to stop leaking, especially when she has a cold  PERTINENT HISTORY:  G4P4 (all vaginal deliveries), tubal ligation, kidney stone followed by sepsis and surgery  BOWEL MOVEMENT: Pain with bowel movement: No Type of bowel movement:Frequency 1x/day or every other day and Strain No Fully empty rectum: Yes: - Leakage: No Pads: No  Fiber supplement: No  URINATION: Pain with urination: No Fully empty bladder: Yes: - Stream: Strong Urgency: Yes: sometimes right in front of the toilet Frequency: an hour after cup of coffee; only 1-2x/day when  at work; will wake up at night to urinate if she drinks tea or water Leakage: Coughing, Sneezing, and running Pads: No  INTERCOURSE: Pain with intercourse:  none Ability to have vaginal penetration:  Yes: - Climax: WNL Marinoff Scale: 3/3  PREGNANCY: Vaginal deliveries 4 Tearing No C-section deliveries 0 Currently pregnant No  PROLAPSE: none   OBJECTIVE:  Note: Objective measures were completed at Evaluation unless otherwise noted. 02/05/23 COGNITION: Overall cognitive status: Within functional limits for tasks assessed     SENSATION: Light touch: Appears intact Proprioception: Appears intact  FUNCTIONAL TESTS:  Squat: Lt weight shift Single leg stance:  Rt: Lt pelvic drop Lt: Rt pelvic drop Curl-up test:  GAIT: Comments: WNL  POSTURE: rounded shoulders, forward head, and posterior pelvic tilt  LUMBARAROM/PROM:  A/PROM A/PROM  Eval (% available)  Flexion 75   Extension 50  Right lateral flexion 75  Left lateral flexion 75  Right rotation 75  Left rotation 75   (Blank rows = not tested)   PALPATION:   General  abdominal tightness, especially upper abdominals with decreased rib cage mobility                External Perineal Exam WNL                             Internal Pelvic Floor WNL  Patient confirms identification and approves PT to assess internal pelvic floor and treatment Yes  PELVIC MMT:    MMT eval  Vaginal 2/5, 3 second endurance, 6 repeat contractions  Diastasis Recti No diastasis and good pressure management without distortion  (Blank rows = not tested)        TONE: WNL  PROLAPSE: Grade 3 anterior/Grade 2 posterior vaginal wall laxity  TODAY'S TREATMENT:                                                                                                                              DATE:  02/12/23 Neuromuscular re-education: Transversus abdominus training with multimodal cues for improved motor control and breath coordination Transversus abdominus isometrics with breath coordination and pelvic floor muscles 2 x 10 Bil supine UE ball press with transversus abdominus and pelvic floor muscle contractions and breath coordination 10x Supine hip adduction ball press with transversus abdominus and pelvic floor muscle contractions and breath coordination 10x Bridge with hip adduction, transversus abdominus, and pelvic floor muscle 2 x 10 Modified dead bug 10x bil  Seated hip adduction ball press with transversus abdominus and pelvic floor muscle 2 x 10 Seated resisted march red band with transversus abdominus and pelvic floor muscle 2 x 10 Seated hip internal rotation with yoga block and red band with transversus abdominus and pelvic floor muscle 2 x 10 Exercises: Child's pose 10 breaths Cat cow 10x Wagging dog 10x Therapeutic activities: HEP clarification on pelvic floor muscle contraction (difference between long holds and quick flicks)   02/05/23  EVAL  Neuromuscular re-education: Pt provides verbal consent for internal vaginal/rectal pelvic floor exam. Pelvic floor muscle contraction training with internal vaginal feedback Quick flicks Long holds Therapeutic activities: Urge drill The knack    PATIENT EDUCATION:  Education details: See above Person educated: Patient Education method: Explanation, Demonstration, Tactile cues, Verbal cues, and  Handouts Education comprehension: verbalized understanding  HOME EXERCISE PROGRAM: [id number]  ASSESSMENT:  CLINICAL IMPRESSION: Pt has not seen progress since first visit, but also needed to clarify HEP and has not performed any activities that would typically make her leak. Today we focused on core training with pelvic floor muscle contractions; she did very well with this. Initially she had difficulty feeling contractions together, but made very good progress. She was able to progress to include exercises that focused on UE and LE in addition to deep core. Good tolerance to all exercises and HEP clarified/updated. She will continue to benefit from skilled PT intervention in order to decrease urinary incontinence, improve pressure management, and begin/progress functional strengthening program.   OBJECTIVE IMPAIRMENTS: decreased activity tolerance, decreased coordination, decreased endurance, decreased strength, increased fascial restrictions, increased muscle spasms, impaired tone, postural dysfunction, and pain.   ACTIVITY LIMITATIONS: continence  PARTICIPATION LIMITATIONS: community activity and occupation  PERSONAL FACTORS: 1 comorbidity: medical history  are also affecting patient's functional outcome.   REHAB POTENTIAL: Good  CLINICAL DECISION MAKING: Stable/uncomplicated  EVALUATION COMPLEXITY: Low   GOALS: Goals reviewed with patient? Yes  SHORT TERM GOALS: Target date: 03/05/2023    Pt will be independent with HEP.   Baseline: Goal status: INITIAL  2.  Pt will increase pelvic floor muscle strength to 2/5. Baseline:  Goal status: INITIAL  3.  Pt will be able to correctly perform diaphragmatic breathing and appropriate pressure management in order to prevent worsening vaginal wall laxity and improve pelvic floor A/ROM.   Baseline:  Goal status: INITIAL  4.  Pt will be independent with the knack, urge suppression technique, and double voiding in order to improve  bladder habits and decrease urinary incontinence.   Baseline:  Goal status: INITIAL    LONG TERM GOALS: Target date: 07/23/23  Pt will be independent with advanced HEP.   Baseline:  Goal status: INITIAL  2.  Pt will increase pelvic floor muscle strength to 4/5. Baseline:  Goal status: INITIAL  3.  Pt will increase pelvic floor muscle endurance to greater than 10 seconds. Baseline:  Goal status: INITIAL  4.  Pt will be able to run without any episodes of leaking. Baseline:  Goal status: INITIAL  5.  Pt will not have any leaking prior to urinating at toilet.  Baseline:  Goal status: INITIAL  6.  Pt will report no leaks with laughing, coughing, sneezing in order to improve comfort with interpersonal relationships and community activities.   Baseline:  Goal status: INITIAL  PLAN:  PT FREQUENCY: 1-2x/week  PT DURATION: 6 months  PLANNED INTERVENTIONS: 97110-Therapeutic exercises, 97530- Therapeutic activity, [id number]- Neuromuscular re-education, 97535- Self Care, [id number]- Manual therapy, Dry Needling, and Biofeedback  PLAN FOR NEXT SESSION: progress core training.    Josette Mares, PT, DPT02/04/252:46 PM

## 2023-02-26 ENCOUNTER — Telehealth: Payer: Self-pay
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
## 2023-04-07 IMAGING — MG MM DIGITAL SCREENING BILAT W/ TOMO AND CAD
6 of 12 series · 6 of 36 positions shown · non-contrast
Comparison: None.

CLINICAL DATA: Screening.

EXAM:
DIGITAL SCREENING BILATERAL MAMMOGRAM WITH TOMOSYNTHESIS AND CAD
TECHNIQUE: Bilateral screening digital craniocaudal and mediolateral oblique
mammograms were obtained. Bilateral screening digital breast
tomosynthesis was performed. The images were evaluated with
computer-aided detection.

[L CC synth-2D]
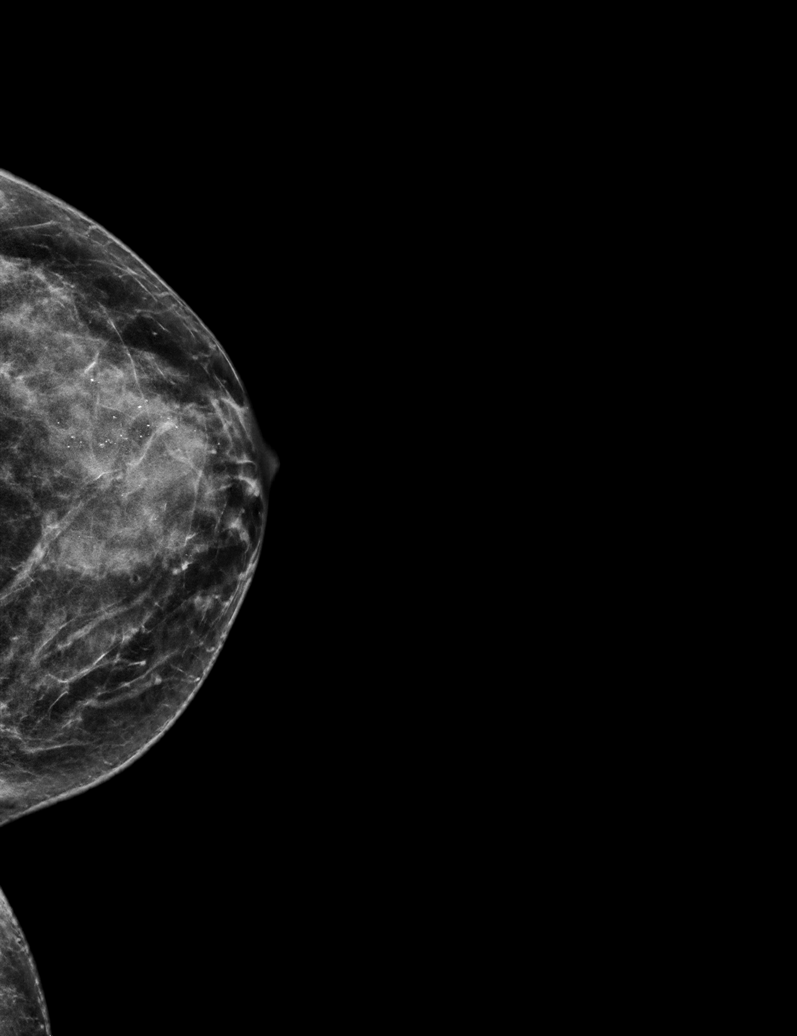

[R XCCL synth-2D]
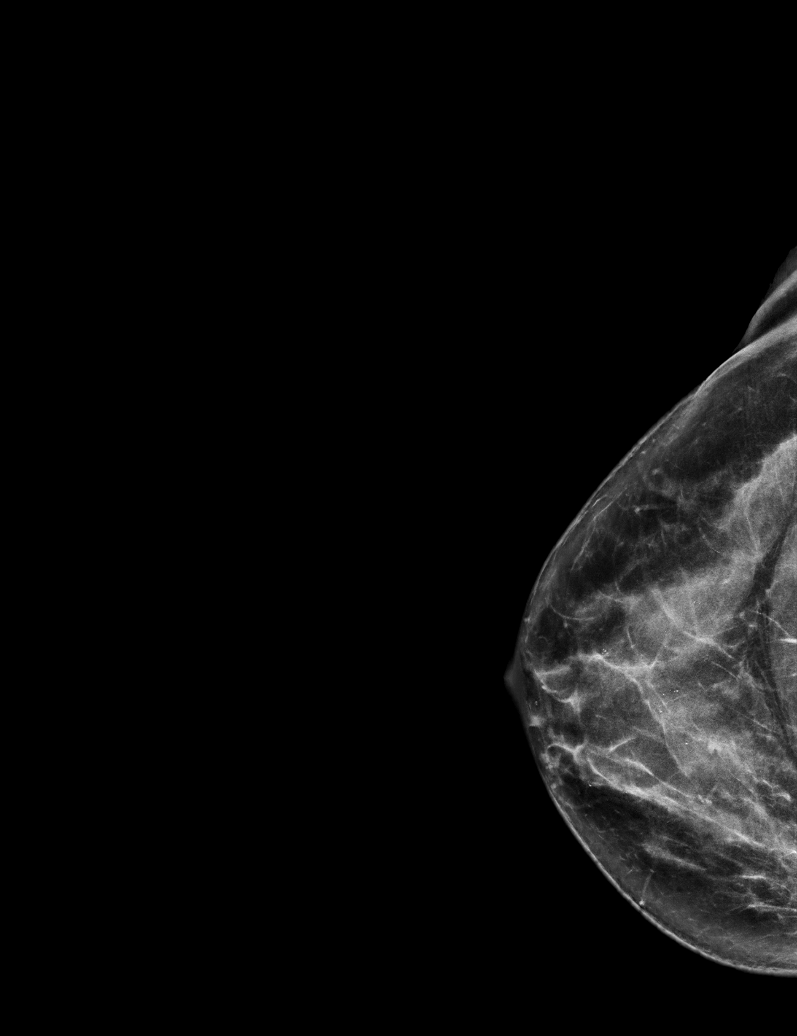

[L MLO synth-2D]
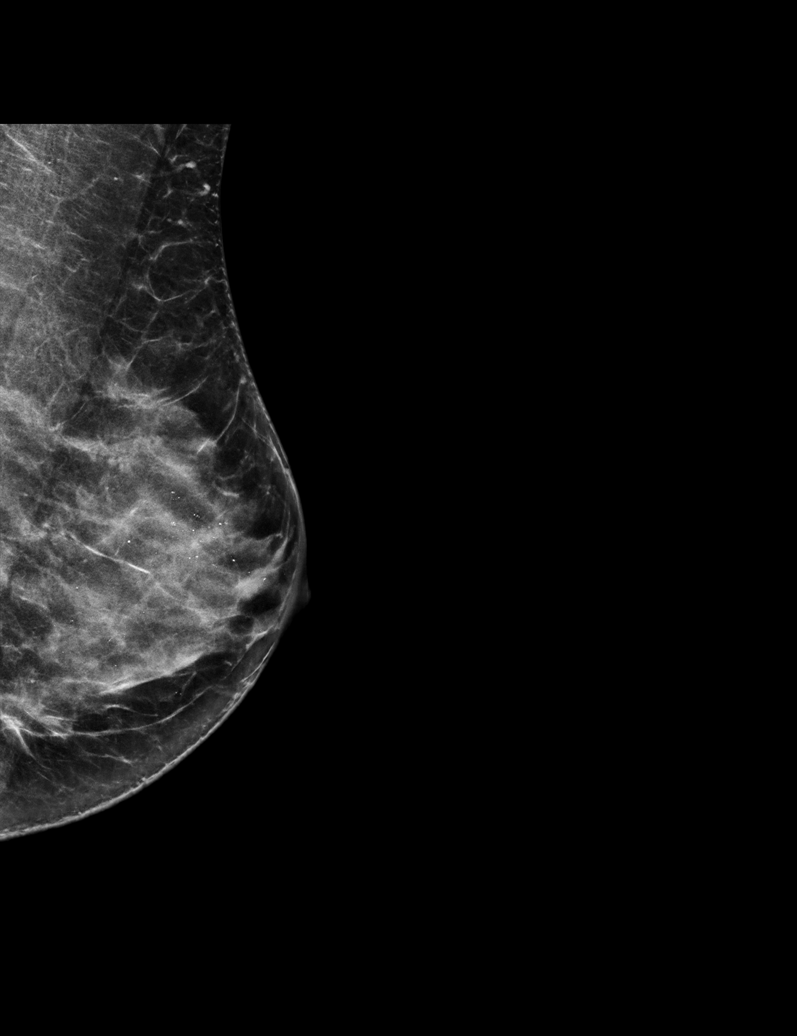

[L XCCL synth-2D]
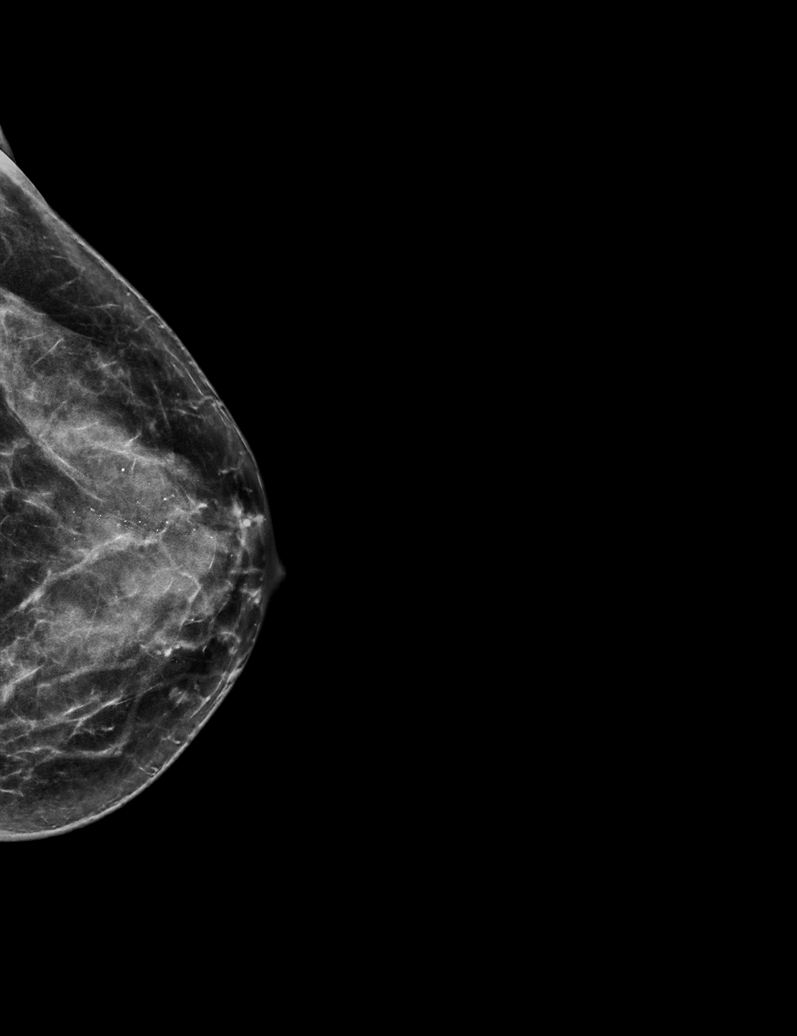

[R MLO synth-2D]
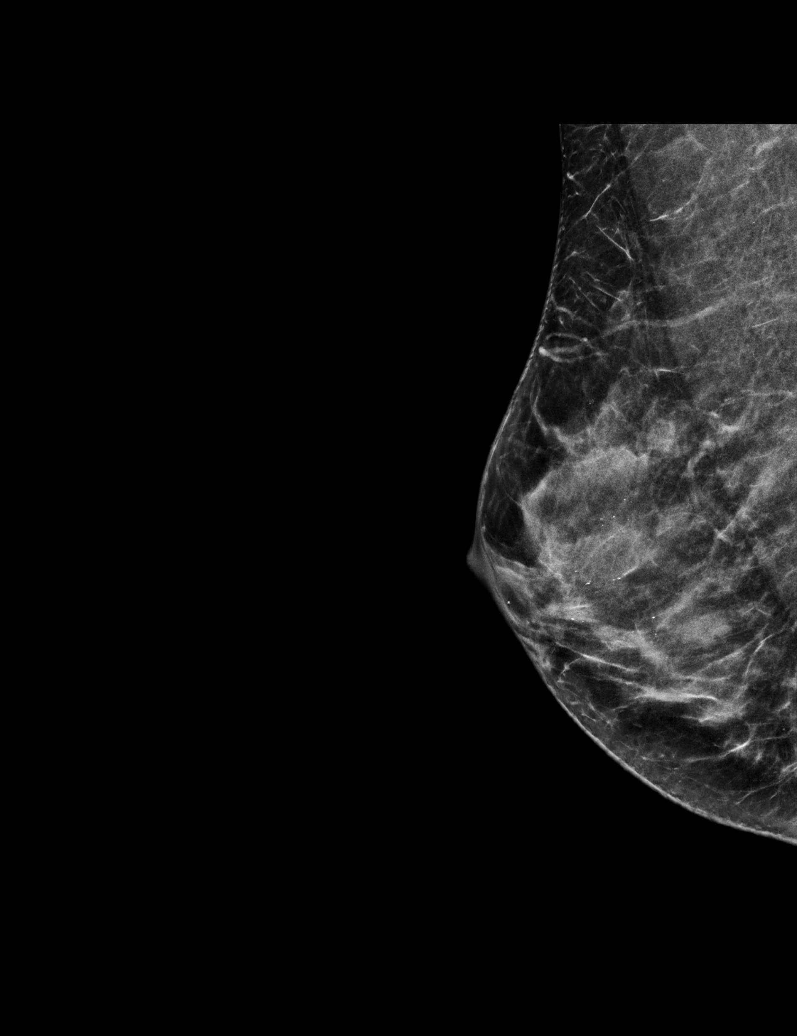

[R CC synth-2D]
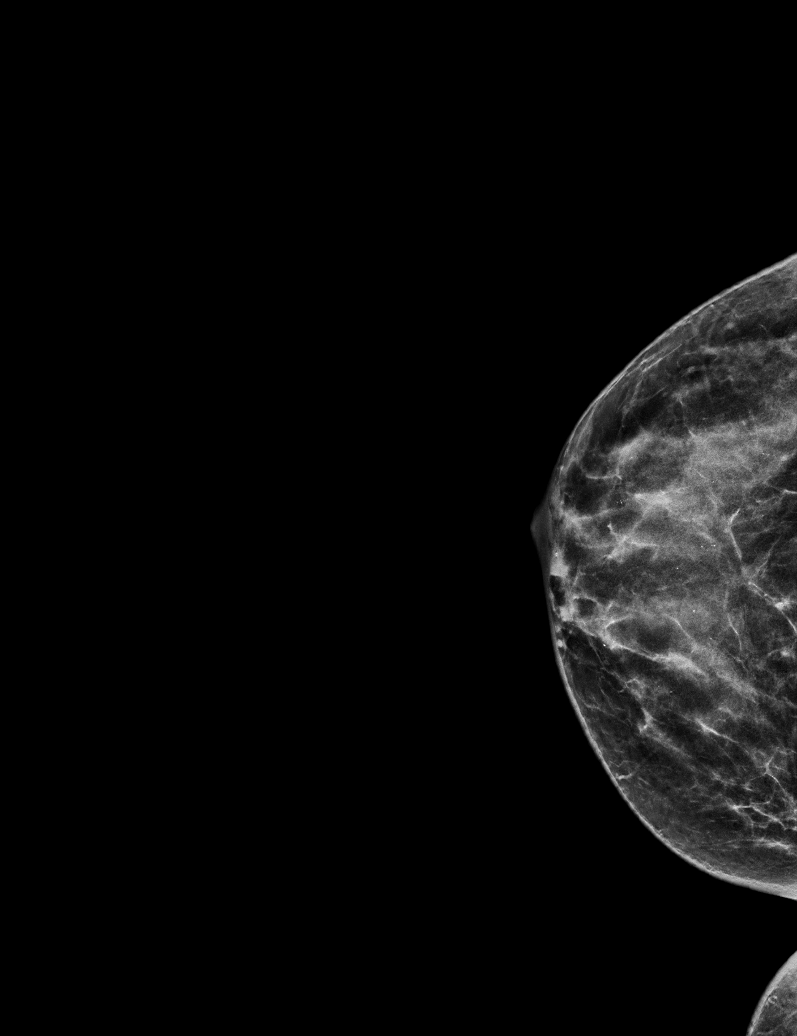

[6 of 36 positions shown; findings below may reference images not displayed]

ACR Breast Density Category c: The breast tissue is heterogeneously
dense, which may obscure small masses.
FINDINGS: In the left breast, a possible mass and a separate group of
calcifications warrant further evaluation. In the right breast, no
findings suspicious for malignancy.
IMPRESSION: Further evaluation is suggested for a possible mass and a separate
group of calcifications in the left breast.

RECOMMENDATION:
Diagnostic mammogram and possibly ultrasound of the left breast.
(Code:[id number]-G-UUL)

The patient will be contacted regarding the findings, and additional
imaging will be scheduled.

BI-RADS CATEGORY  0: Incomplete. Need additional imaging evaluation
and/or prior mammograms for comparison.

## 2023-05-23 ENCOUNTER — Emergency Department (HOSPITAL_BASED_OUTPATIENT_CLINIC_OR_DEPARTMENT_OTHER): Admission: EM | Admit: 2023-05-23 | Discharge: 2023-05-23 | Discharge status: Absent without leave

## 2023-05-23 ENCOUNTER — Emergency Department (HOSPITAL_COMMUNITY)

## 2023-05-23 ENCOUNTER — Encounter (HOSPITAL_COMMUNITY): Payer: Self-pay | Admitting: Emergency Medicine

## 2023-05-23 ENCOUNTER — Emergency Department (HOSPITAL_COMMUNITY)
Admission: EM | Admit: 2023-05-23 | Discharge: 2023-05-23 | Disposition: A | Discharge status: Home / Self Care | Attending: Emergency Medicine | Admitting: Emergency Medicine

## 2023-05-23 ENCOUNTER — Other Ambulatory Visit: Payer: Self-pay

## 2023-05-23 DIAGNOSIS — Y92410 Unspecified street and highway as the place of occurrence of the external cause: Secondary | ICD-10-CM | POA: Insufficient documentation

## 2023-05-23 DIAGNOSIS — M25512 Pain in left shoulder: Secondary | ICD-10-CM | POA: Insufficient documentation

## 2023-05-23 MED ORDER — NAPROXEN 375 MG PO TABS: 375.0000 mg | ORAL_TABLET | Freq: Two times a day (BID) | ORAL | 0 refills | Status: AC

## 2023-05-23 MED ORDER — CYCLOBENZAPRINE HCL 10 MG PO TABS: 5.0000 mg | ORAL_TABLET | Freq: Two times a day (BID) | ORAL | 0 refills | Status: AC | PRN

## 2023-05-23 NOTE — ED Notes (Signed)
Patient verbalizes understanding of discharge instructions. Opportunity for questioning and answers were provided. Armband removed by staff, pt discharged from ED. Wheeled out to lobby, left with family

## 2023-05-23 NOTE — Discharge Instructions (Signed)
 Return to the emergency department immediately if you develop any of the following symptoms: You have numbness, tingling, or weakness in the arms or legs. You develop severe headaches not relieved with medicine. You have severe neck pain, especially tenderness in the middle of the back of your neck. You have changes in bowel or bladder control. There is increasing pain in any area of the body. You have shortness of breath, light-headedness, dizziness, or fainting. You have chest pain. You feel sick to your stomach (nauseous), throw up (vomit), or sweat. You have increasing abdominal discomfort. There is blood in your urine, stool, or vomit. You have pain in your shoulder (shoulder strap areas). You feel your symptoms are getting worse.
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
## 2023-05-23 NOTE — ED Notes (Signed)
 PT IS NOT BEING SEEN, SHE IS ONLY ACCOMPANYING HER SON TO THE ED. SHE HAS ALREADY BEEN SEEN PRIOR

## 2023-05-24 ENCOUNTER — Ambulatory Visit: Payer: Self-pay

## 2023-05-24 ENCOUNTER — Inpatient Hospital Stay: Admitting: Family Medicine

## 2023-05-24 NOTE — Telephone Encounter (Signed)
 Appt today
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
## 2023-05-24 NOTE — Progress Notes (Deleted)
   Courtney [name] is a 48 y.o. female who presents today for an office visit.  Assessment/Plan:  New/Acute Problems: ***  Chronic Problems Addressed Today: No problem-specific Assessment & Plan notes found for this encounter.     Subjective:  HPI:  Patient here for Emergency Department follow up.         Objective:  Physical Exam: There were no vitals taken for this visit.  Gen: No acute distress, resting comfortably*** CV: Regular rate and rhythm with no murmurs appreciated Pulm: Normal work of breathing, clear to auscultation bilaterally with no crackles, wheezes, or rhonchi Neuro: Grossly normal, moves all extremities Psych: Normal affect and thought content      Kyoko Elsea M. Daneil Dunker, MD 05/24/2023 1:28 PM

## 2023-05-27 ENCOUNTER — Inpatient Hospital Stay: Admitting: Physician Assistant

## 2023-05-27 DIAGNOSIS — M79662 Pain in left lower leg: Secondary | ICD-10-CM | POA: Insufficient documentation

## 2023-05-28 ENCOUNTER — Ambulatory Visit (HOSPITAL_COMMUNITY)
Admission: RE | Admit: 2023-05-28 | Discharge: 2023-05-28 | Disposition: A | Discharge status: Home / Self Care | Source: Ambulatory Visit | Attending: Vascular Surgery | Admitting: Vascular Surgery

## 2023-05-28 ENCOUNTER — Ambulatory Visit: Admitting: Physician Assistant

## 2023-05-28 ENCOUNTER — Other Ambulatory Visit (HOSPITAL_COMMUNITY): Payer: Self-pay | Admitting: Physician Assistant

## 2023-05-28 VITALS — BP 102/72 | HR 80 | Temp 98.6°F | Ht 65.0 in | Wt 154.6 lb

## 2023-05-28 DIAGNOSIS — R600 Localized edema: Secondary | ICD-10-CM | POA: Diagnosis not present

## 2023-05-28 DIAGNOSIS — M79605 Pain in left leg: Secondary | ICD-10-CM | POA: Insufficient documentation

## 2023-05-28 MED ORDER — KETOROLAC TROMETHAMINE 60 MG/2ML IM SOLN
60.0000 mg | Freq: Once | INTRAMUSCULAR | Status: AC
  Administered 2023-05-28: 60 mg via INTRAMUSCULAR
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
## 2023-06-06 DIAGNOSIS — S86119A Strain of other muscle(s) and tendon(s) of posterior muscle group at lower leg level, unspecified leg, initial encounter: Secondary | ICD-10-CM | POA: Insufficient documentation

## 2023-07-08 DIAGNOSIS — M7502 Adhesive capsulitis of left shoulder: Secondary | ICD-10-CM | POA: Insufficient documentation

## 2023-08-12 DIAGNOSIS — M25612 Stiffness of left shoulder, not elsewhere classified: Secondary | ICD-10-CM | POA: Insufficient documentation

## 2023-10-08 ENCOUNTER — Ambulatory Visit: Admitting: Physician Assistant

## 2023-10-09 ENCOUNTER — Encounter: Payer: Self-pay | Admitting: Physician Assistant

## 2023-10-10 ENCOUNTER — Ambulatory Visit: Payer: Self-pay

## 2023-10-10 NOTE — Telephone Encounter (Signed)
 FYI Only or Action Required?: Action required by provider: request for appointment and medication refill request.  Patient was last seen in primary care on 05/28/2023 by Allwardt, Alyssa M, PA-C.  Called Nurse Triage reporting Leg Pain.  Symptoms began several days ago.  Interventions attempted: OTC medications:  SABRA  Symptoms are: gradually worsening.  Triage Disposition: See PCP When Office is Open (Within 3 Days)  Patient/caregiver understands and will follow disposition?: Yes    Copied from CRM #[id number]. Topic: Clinical - Red Word Triage >> Oct 10, 2023 11:13 AM Suzen RAMAN wrote: Red Word that prompted transfer to Nurse Triage: leg pain, requesting to r/s missed appt from 10/08/23. Answer Assessment - Initial Assessment Questions 1. ONSET: When did the pain start?      3 days ago 2. LOCATION: Where is the pain located?      Left leg 3. PAIN: How bad is the pain?    (Scale 1-10; or mild, moderate, severe)     6 4. WORK OR EXERCISE: Has there been any recent work or exercise that involved this part of the body?      no 5. CAUSE: What do you think is causing the leg pain?     unsure 6. OTHER SYMPTOMS: Do you have any other symptoms? (e.g., chest pain, back pain, breathing difficulty, swelling, rash, fever, numbness, weakness)     no 7. PREGNANCY: Is there any chance you are pregnant? When was your last menstrual period?     no  Protocols used: Leg Pain-A-AH  Reason for Disposition  [1] MODERATE pain (e.g., interferes with normal activities, limping) AND [2] present > 3 days  Answer Assessment - Initial Assessment Questions 1. ONSET: When did the pain start?      3 days ago 2. LOCATION: Where is the pain located?      Left leg 3. PAIN: How bad is the pain?    (Scale 1-10; or mild, moderate, severe)     6 4. WORK OR EXERCISE: Has there been any recent work or exercise that involved this part of the body?      no 5. CAUSE: What do you think is  causing the leg pain?     unsure 6. OTHER SYMPTOMS: Do you have any other symptoms? (e.g., chest pain, back pain, breathing difficulty, swelling, rash, fever, numbness, weakness)     no 7. PREGNANCY: Is there any chance you are pregnant? When was your last menstrual period?     no  Protocols used: Leg Pain-A-AH

## 2023-10-10 NOTE — Telephone Encounter (Signed)
 Noted

## 2023-10-11 ENCOUNTER — Encounter: Payer: Self-pay | Admitting: Physician Assistant

## 2023-10-11 ENCOUNTER — Ambulatory Visit: Admitting: Physician Assistant

## 2023-10-11 VITALS — BP 122/70 | HR 76 | Temp 97.6°F | Ht 65.0 in | Wt 156.0 lb

## 2023-10-11 DIAGNOSIS — M79605 Pain in left leg: Secondary | ICD-10-CM | POA: Diagnosis not present

## 2023-10-11 NOTE — Patient Instructions (Signed)
  VISIT SUMMARY: You visited us  today due to persistent pain in your left leg following a calf muscle rupture earlier this year. The pain has worsened with prolonged standing and recent increased physical activity.  YOUR PLAN: CHRONIC LEFT CALF MUSCLE INJURY WITH RECURRENT PAIN AND TENDERNESS: You have ongoing pain and tenderness in your left calf following a muscle rupture. The pain has increased recently due to more physical activity. -Rest and use a heating pad over the weekend. -Take ibuprofen  in the morning and evening with food to avoid stomach upset. -Contact [facility] for a follow-up appointment next week to reassess the injury. -Monitor for any worsening symptoms over the weekend and seek care if necessary.                      Contains text generated by Abridge.                                 Contains text generated by Abridge.
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
